# Patient Record
Sex: Female | Born: 1995 | Race: White | Hispanic: No | Marital: Single | Smoking: Never smoker
Health system: Southern US, Community
[De-identification: ages and names within clinical notes are randomized; demographics above are authoritative.]

## PROBLEM LIST (undated history)

## (undated) DIAGNOSIS — G43909 Migraine, unspecified, not intractable, without status migrainosus: Secondary | ICD-10-CM

## (undated) DIAGNOSIS — F419 Anxiety disorder, unspecified: Secondary | ICD-10-CM

## (undated) DIAGNOSIS — R630 Anorexia: Secondary | ICD-10-CM

## (undated) DIAGNOSIS — J45909 Unspecified asthma, uncomplicated: Secondary | ICD-10-CM

## (undated) HISTORY — DX: Anxiety disorder, unspecified: F41.9

## (undated) HISTORY — DX: Migraine, unspecified, not intractable, without status migrainosus: G43.909

## (undated) HISTORY — PX: LUMBAR PUNCTURE: SHX1985

## (undated) HISTORY — DX: Anorexia: R63.0

## (undated) HISTORY — DX: Unspecified asthma, uncomplicated: J45.909

---

## 2020-03-17 ENCOUNTER — Other Ambulatory Visit: Payer: Self-pay

## 2020-03-17 ENCOUNTER — Emergency Department (HOSPITAL_COMMUNITY)
Admission: EM | Admit: 2020-03-17 | Discharge: 2020-03-17 | Disposition: A | Discharge status: Home / Self Care | Payer: Self-pay | Attending: Emergency Medicine | Admitting: Emergency Medicine

## 2020-03-17 ENCOUNTER — Encounter (HOSPITAL_COMMUNITY): Payer: Self-pay

## 2020-03-17 DIAGNOSIS — R110 Nausea: Secondary | ICD-10-CM | POA: Insufficient documentation

## 2020-03-17 MED ORDER — ONDANSETRON 4 MG PO TBDP
4.0000 mg | ORAL_TABLET | Freq: Once | ORAL | Status: AC | PRN
  Administered 2020-03-17: 10:00:00 4 mg via ORAL
  Filled 2020-03-17: qty 1

## 2020-03-17 NOTE — ED Notes (Signed)
Patient denies pain and is resting comfortably.  

## 2020-03-17 NOTE — ED Notes (Signed)
Patient discharged, vetrbalized understanding of DC instructions.  ambulated to WR   0/ 10 pain    

## 2020-03-17 NOTE — ED Notes (Addendum)
Wants to leave AMA.  MD made aware, to speak with patient.

## 2020-03-17 NOTE — ED Provider Notes (Signed)
[facility] EMERGENCY DEPARTMENT Provider Note   CSN: [id number] Arrival date & time: 03/17/20  1003   History Chief Complaint  Patient presents with  . Nausea    Michelle Turner is a 24 y.o. female who presents with nausea. She states that she's been taking Clindamycin on an empty stomach for a dental problem. It's been giving her a lot of nausea so she came to the ED. She was given Zofran in triage and feels better and would like to be discharged. She is still having some indigestion but otherwise feels back to normal. She denies fever, chills, abdominal pain, vomiting, diarrhea, urinary symptoms. Her LMP was a week ago.  HPI     History reviewed. No pertinent past medical history.  There are no problems to display for this patient.   History reviewed. No pertinent surgical history.   OB History   No obstetric history on file.     No family history on file.     Home Medications Prior to Admission medications   Not on File    Allergies    Patient has no known allergies.  Review of Systems   Review of Systems  Constitutional: Negative for fever.  Gastrointestinal: Positive for nausea. Negative for abdominal pain, diarrhea and vomiting.    Physical Exam Updated Vital Signs BP (!) 109/56   Pulse 70   Temp 97.6 F (36.4 C) (Oral)   Resp (!) 22   Ht 5\' 4"  (1.626 m)   Wt 49.9 kg   SpO2 99%   BMI 18.88 kg/m   Physical Exam Vitals and nursing note reviewed.  Constitutional:      General: She is not in acute distress.    Appearance: Normal appearance. She is well-developed and well-nourished. She is not ill-appearing.     Comments: Tearful. Cooperative. NAD  HENT:     Head: Normocephalic and atraumatic.  Eyes:     General: No scleral icterus.       Right eye: No discharge.        Left eye: No discharge.     Conjunctiva/sclera: Conjunctivae normal.     Pupils: Pupils are equal, round, and reactive to light.  Cardiovascular:     Rate  and Rhythm: Normal rate.  Pulmonary:     Effort: Pulmonary effort is normal. No respiratory distress.  Abdominal:     General: There is no distension.  Musculoskeletal:     Cervical back: Normal range of motion.  Skin:    General: Skin is warm and dry.  Neurological:     Mental Status: She is alert and oriented to person, place, and time.  Psychiatric:        Mood and Affect: Mood and affect normal.        Behavior: Behavior normal.     ED Results / Procedures / Treatments   Labs (all labs ordered are listed, but only abnormal results are displayed) Labs Reviewed - No data to display  EKG None  Radiology No results found.  Procedures Procedures (including critical care time)  Medications Ordered in ED Medications  ondansetron (ZOFRAN-ODT) disintegrating tablet 4 mg (4 mg Oral Given 03/17/20 1025)    ED Course  I have reviewed the triage vital signs and the nursing notes.  Pertinent labs & imaging results that were available during my care of the patient were reviewed by me and considered in my medical decision making (see chart for details).  24 year old female presents with  nausea which she believes is related to taking Clindamycin on an empty stomach. 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 The Zofran she took in triage resolved her symptoms. She was PO challenged with a cracker which has caused her to be upset but she has been able to tolerate it. Her LMP was a week ago and she declines pregnancy testing. She denies any abdominal pain so I do not feel we need to do any further work up here in the ED. She was advised to return if worsening.  MDM Rules/Calculators/A&P                          Final Clinical Impression(s) / ED Diagnoses Final diagnoses:  Nausea    Rx / DC Orders ED Discharge Orders    None       Bethel Born, PA-C 03/17/20 1259    Pollyann Savoy, MD 03/17/20 1432

## 2020-03-17 NOTE — ED Notes (Signed)
Patient states she feels a little better after nausea meds.   Patient states she has been taking it on a empty stomach.

## 2020-03-17 NOTE — ED Triage Notes (Signed)
Pt reports nausea and indigestion after taking clindamycin for post wisdom tooth extraction, denies any difficulty breathing or swallowing. Reports she takes it on an empty stomach.

## 2020-03-17 NOTE — ED Notes (Signed)
Denies COVID interactions  Vaxxed- x2. Radiographer, therapeutic

## 2020-12-19 LAB — OB RESULTS CONSOLE GC/CHLAMYDIA
Chlamydia: NEGATIVE
Gonorrhea: NEGATIVE

## 2020-12-19 LAB — OB RESULTS CONSOLE ANTIBODY SCREEN: Antibody Screen: NEGATIVE

## 2020-12-19 LAB — OB RESULTS CONSOLE PLATELET COUNT: Platelets: 304

## 2020-12-19 LAB — OB RESULTS CONSOLE ABO/RH: RH Type: NEGATIVE

## 2020-12-19 LAB — OB RESULTS CONSOLE HEPATITIS B SURFACE ANTIGEN: Hepatitis B Surface Ag: NEGATIVE

## 2020-12-19 LAB — OB RESULTS CONSOLE HIV ANTIBODY (ROUTINE TESTING): HIV: NONREACTIVE

## 2020-12-19 LAB — OB RESULTS CONSOLE RPR: RPR: NONREACTIVE

## 2020-12-19 LAB — OB RESULTS CONSOLE HGB/HCT, BLOOD
HCT: 41 (ref 29–41)
Hemoglobin: 14.2

## 2020-12-19 LAB — HEPATITIS C ANTIBODY: HCV Ab: NEGATIVE

## 2020-12-19 LAB — OB RESULTS CONSOLE RUBELLA ANTIBODY, IGM: Rubella: IMMUNE

## 2021-02-23 ENCOUNTER — Encounter: Payer: Self-pay | Admitting: Obstetrics and Gynecology

## 2021-02-23 ENCOUNTER — Other Ambulatory Visit: Payer: Self-pay

## 2021-02-23 ENCOUNTER — Ambulatory Visit (INDEPENDENT_AMBULATORY_CARE_PROVIDER_SITE_OTHER): Payer: Medicaid Other | Admitting: Obstetrics and Gynecology

## 2021-02-23 DIAGNOSIS — Z3400 Encounter for supervision of normal first pregnancy, unspecified trimester: Secondary | ICD-10-CM | POA: Insufficient documentation

## 2021-02-23 DIAGNOSIS — O26899 Other specified pregnancy related conditions, unspecified trimester: Secondary | ICD-10-CM | POA: Insufficient documentation

## 2021-02-23 DIAGNOSIS — Z6791 Unspecified blood type, Rh negative: Secondary | ICD-10-CM | POA: Insufficient documentation

## 2021-02-23 LAB — POCT URINALYSIS DIPSTICK
Bilirubin, UA: NEGATIVE
Glucose, UA: NEGATIVE
Nitrite, UA: NEGATIVE
Protein, UA: NEGATIVE
Spec Grav, UA: 1.015 (ref 1.010–1.025)
Urobilinogen, UA: 0.2 E.U./dL
pH, UA: 6.5 (ref 5.0–8.0)

## 2021-02-23 MED ORDER — COMFORT FIT MATERNITY SUPP MED MISC: 0 refills | Status: DC

## 2021-02-23 NOTE — Progress Notes (Signed)
  Subjective:    Michelle Turner is a G1P0 [redacted]w[redacted]d being seen today for her first obstetrical visit.  Patient is transferring care from [facility]. Her obstetrical history is significant for first pregnancy. Patient was found to be Rh negative. Patient does intend to breast feed. Pregnancy history fully reviewed.  Patient reports no complaints.  Vitals:   02/23/21 1507  BP: 105/70  Pulse: 90  Weight: 125 lb (56.7 kg)    HISTORY: OB History  Gravida Para Term Preterm AB Living  1            SAB IAB Ectopic Multiple Live Births               # Outcome Date GA Lbr Len/2nd Weight Sex Delivery Anes PTL Lv  1 Current            Past Medical History:  Diagnosis Date  . Anorexia   . Anxiety   . Asthma   . Migraines    Past Surgical History:  Procedure Laterality Date  . LUMBAR PUNCTURE     No family history on file.   Exam    Uterus:  Fundal Height: 21 cm21- weeks      Assessment:    Pregnancy: G1P0 Patient Active Problem List   Diagnosis Date Noted  . Supervision of normal first pregnancy, antepartum 02/23/2021  . Rh negative state in antepartum period 02/23/2021        Plan:     Initial labs drawn. Prenatal vitamins. Problem list reviewed and updated. Genetic Screening discussed : results reviewed.  Ultrasound discussed; fetal survey: ordered.  Follow up in 4 weeks. 50% of 30 min visit spent on counseling and coordination of care.     Edmund Holcomb 02/23/2021

## 2021-02-23 NOTE — Patient Instructions (Signed)
Second Trimester of Pregnancy The second trimester of pregnancy is from week 13 through week 27. This is months 4 through 6 of pregnancy. The second trimester is often a time when you feel your best. Your body has adjusted to being pregnant, and you begin to feel better physically. During the second trimester: Morning sickness has lessened or stopped completely. You may have more energy. You may have an increase in appetite. The second trimester is also a time when the unborn baby (fetus) is growing rapidly. At the end of the sixth month, the fetus may be up to 12 inches long and weigh about 1 pounds. You will likely begin to feel the baby move (quickening) between 16 and 20 weeks of pregnancy. Body changes during your second trimester Your body continues to go through many changes during your second trimester. The changes vary and generally return to normal after the baby is born. Physical changes Your weight will continue to increase. You will notice your lower abdomen bulging out. You may begin to get stretch marks on your hips, abdomen, and breasts. Your breasts will continue to grow and to become tender. Dark spots or blotches (chloasma or mask of pregnancy) may develop on your face. A dark line from your belly button to the pubic area (linea nigra) may appear. You may have changes in your hair. These can include thickening of your hair, rapid growth, and changes in texture. Some people also have hair loss during or after pregnancy, or hair that feels dry or thin. Health changes You may develop headaches. You may have heartburn. You may develop constipation. You may develop hemorrhoids or swollen, bulging veins (varicose veins). Your gums may bleed and may be sensitive to brushing and flossing. You may urinate more often because the fetus is pressing on your bladder. You may have back pain. This is caused by: Weight gain. Pregnancy hormones that are relaxing the joints in your  pelvis. A shift in weight and the muscles that support your balance. Follow these instructions at home: Medicines Follow your health care provider's instructions regarding medicine use. Specific medicines may be either safe or unsafe to take during pregnancy. Do not take any medicines unless approved by your health care provider. Take a prenatal vitamin that contains at least 600 micrograms (mcg) of folic acid. Eating and drinking Eat a healthy diet that includes fresh fruits and vegetables, whole grains, good sources of protein such as meat, eggs, or tofu, and low-fat dairy products. Avoid raw meat and unpasteurized juice, milk, and cheese. These carry germs that can harm you and your baby. You may need to take these actions to prevent or treat constipation: Drink enough fluid to keep your urine pale yellow. Eat foods that are high in fiber, such as beans, whole grains, and fresh fruits and vegetables. Limit foods that are high in fat and processed sugars, such as fried or sweet foods. Activity Exercise only as directed by your health care provider. Most people can continue their usual exercise routine during pregnancy. Try to exercise for 30 minutes at least 5 days a week. Stop exercising if you develop contractions in your uterus. Stop exercising if you develop pain or cramping in the lower abdomen or lower back. Avoid exercising if it is very hot or humid or if you are at a high altitude. Avoid heavy lifting. If you choose to, you may have sex unless your health care provider tells you not to. Relieving pain and discomfort Wear a supportive bra  to prevent discomfort from breast tenderness. Take warm sitz baths to soothe any pain or discomfort caused by hemorrhoids. Use hemorrhoid cream if your health care provider approves. Rest with your legs raised (elevated) if you have leg cramps or low back pain. If you develop varicose veins: Wear support hose as told by your health care  provider. Elevate your feet for 15 minutes, 3-4 times a day. Limit salt in your diet. Safety Wear your seat belt at all times when driving or riding in a car. Talk with your health care provider if someone is verbally or physically abusive to you. Lifestyle Do not use hot tubs, steam rooms, or saunas. Do not douche. Do not use tampons or scented sanitary pads. Avoid cat litter boxes and soil used by cats. These carry germs that can cause birth defects in the baby and possibly loss of the fetus by miscarriage or stillbirth. Do not use herbal remedies, alcohol, illegal drugs, or medicines that are not approved by your health care provider. Chemicals in these products can harm your baby. Do not use any products that contain nicotine or tobacco, such as cigarettes, e-cigarettes, and chewing tobacco. If you need help quitting, ask your health care provider. General instructions During a routine prenatal visit, your health care provider will do a physical exam and other tests. He or she will also discuss your overall health. Keep all follow-up visits. This is important. Ask your health care provider for a referral to a local prenatal education class. Ask for help if you have counseling or nutritional needs during pregnancy. Your health care provider can offer advice or refer you to specialists for help with various needs. Where to find more information American Pregnancy Association: americanpregnancy.org [employer]: https://www.todd-brady.net/ Office on [employer] Health: [employer].nz Contact a health care provider if you have: A headache that does not go away when you take medicine. Vision changes or you see spots in front of your eyes. Mild pelvic cramps, pelvic pressure, or nagging pain in the abdominal area. Persistent nausea, vomiting, or diarrhea. A bad-smelling vaginal discharge or foul-smelling urine. Pain when you  urinate. Sudden or extreme swelling of your face, hands, ankles, feet, or legs. A fever. Get help right away if you: Have fluid leaking from your vagina. Have spotting or bleeding from your vagina. Have severe abdominal cramping or pain. Have difficulty breathing. Have chest pain. Have fainting spells. Have not felt your baby move for the time period told by your health care provider. Have new or increased pain, swelling, or redness in an arm or leg. Summary The second trimester of pregnancy is from week 13 through week 27 (months 4 through 6). Do not use herbal remedies, alcohol, illegal drugs, or medicines that are not approved by your health care provider. Chemicals in these products can harm your baby. Exercise only as directed by your health care provider. Most people can continue their usual exercise routine during pregnancy. Keep all follow-up visits. This is important. This information is not intended to replace advice given to you by your health care provider. Make sure you discuss any questions you have with your health care provider. Document Revised: 08/19/2019 Document Reviewed: 06/25/2019 Elsevier Patient Education  2022 [employer].  Third Trimester of Pregnancy The third trimester of pregnancy is from week 28 through week 40. This is months 7 through 9. The third trimester is a time when the unborn baby (fetus) is growing rapidly. At the end of the ninth month,  the fetus is about 20 inches long and weighs 6-10 pounds. Body changes during your third trimester During the third trimester, your body will continue to go through many changes. The changes vary and generally return to normal after your baby is born. Physical changes Your weight will continue to increase. You can expect to gain 25-35 pounds (11-16 kg) by the end of the pregnancy if you begin pregnancy at a normal weight. If you are underweight, you can expect to gain 28-40 lb (about 13-18 kg), and if you are  overweight, you can expect to gain 15-25 lb (about 7-11 kg). You may begin to get stretch marks on your hips, abdomen, and breasts. Your breasts will continue to grow and may hurt. A yellow fluid (colostrum) may leak from your breasts. This is the first milk you are producing for your baby. You may have changes in your hair. These can include thickening of your hair, rapid growth, and changes in texture. Some people also have hair loss during or after pregnancy, or hair that feels dry or thin. Your belly button may stick out. You may notice more swelling in your hands, face, or ankles. Health changes You may have heartburn. You may have constipation. You may develop hemorrhoids. You may develop swollen, bulging veins (varicose veins) in your legs. You may have increased body aches in the pelvis, back, or thighs. This is due to weight gain and increased hormones that are relaxing your joints. You may have increased tingling or numbness in your hands, arms, and legs. The skin on your abdomen may also feel numb. You may feel short of breath because of your expanding uterus. Other changes You may urinate more often because the fetus is moving lower into your pelvis and pressing on your bladder. You may have more problems sleeping. This may be caused by the size of your abdomen, an increased need to urinate, and an increase in your body's metabolism. You may notice the fetus "dropping," or moving lower in your abdomen (lightening). You may have increased vaginal discharge. You may notice that you have pain around your pelvic bone as your uterus distends. Follow these instructions at home: Medicines Follow your health care provider's instructions regarding medicine use. Specific medicines may be either safe or unsafe to take during pregnancy. Do not take any medicines unless approved by your health care provider. Take a prenatal vitamin that contains at least 600 micrograms (mcg) of folic  acid. Eating and drinking Eat a healthy diet that includes fresh fruits and vegetables, whole grains, good sources of protein such as meat, eggs, or tofu, and low-fat dairy products. Avoid raw meat and unpasteurized juice, milk, and cheese. These carry germs that can harm you and your baby. Eat 4 or 5 small meals rather than 3 large meals a day. You may need to take these actions to prevent or treat constipation: Drink enough fluid to keep your urine pale yellow. Eat foods that are high in fiber, such as beans, whole grains, and fresh fruits and vegetables. Limit foods that are high in fat and processed sugars, such as fried or sweet foods. Activity Exercise only as directed by your health care provider. Most people can continue their usual exercise routine during pregnancy. Try to exercise for 30 minutes at least 5 days a week. Stop exercising if you experience contractions in the uterus. Stop exercising if you develop pain or cramping in the lower abdomen or lower back. Avoid heavy lifting. Do not exercise if it  is very hot or humid or if you are at a high altitude. If you choose to, you may continue to have sex unless your health care provider tells you not to. Relieving pain and discomfort Take frequent breaks and rest with your legs raised (elevated) if you have leg cramps or low back pain. Take warm sitz baths to soothe any pain or discomfort caused by hemorrhoids. Use hemorrhoid cream if your health care provider approves. Wear a supportive bra to prevent discomfort from breast tenderness. If you develop varicose veins: Wear support hose as told by your health care provider. Elevate your feet for 15 minutes, 3-4 times a day. Limit salt in your diet. Safety Talk to your health care provider before traveling far distances. Do not use hot tubs, steam rooms, or saunas. Wear your seat belt at all times when driving or riding in a car. Talk with your health care provider if someone is  verbally or physically abusive to you. Preparing for birth To prepare for the arrival of your baby: Take prenatal classes to understand, practice, and ask questions about labor and delivery. Visit the hospital and tour the maternity area. Purchase a rear-facing car seat and make sure you know how to install it in your car. Prepare the baby's room or sleeping area. Make sure to remove all pillows and stuffed animals from the baby's crib to prevent suffocation. General instructions Avoid cat litter boxes and soil used by cats. These carry germs that can cause birth defects in the baby. If you have a cat, ask someone to clean the litter box for you. Do not douche or use tampons. Do not use scented sanitary pads. Do not use any products that contain nicotine or tobacco, such as cigarettes, e-cigarettes, and chewing tobacco. If you need help quitting, ask your health care provider. Do not use any herbal remedies, illegal drugs, or medicines that were not prescribed to you. Chemicals in these products can harm your baby. Do not drink alcohol. You will have more frequent prenatal exams during the third trimester. During a routine prenatal visit, your health care provider will do a physical exam, perform tests, and discuss your overall health. Keep all follow-up visits. This is important. Where to find more information American Pregnancy Association: americanpregnancy.org [employer]: https://www.todd-brady.net/ Office on [employer] Health: [employer] Contact a health care provider if you have: A fever. Mild pelvic cramps, pelvic pressure, or nagging pain in your abdominal area or lower back. Vomiting or diarrhea. Bad-smelling vaginal discharge or foul-smelling urine. Pain when you urinate. A headache that does not go away when you take medicine. Visual changes or see spots in front of your eyes. Get help right away if: Your water  breaks. You have regular contractions less than 5 minutes apart. You have spotting or bleeding from your vagina. You have severe abdominal pain. You have difficulty breathing. You have chest pain. You have fainting spells. You have not felt your baby move for the time period told by your health care provider. You have new or increased pain, swelling, or redness in an arm or leg. Summary The third trimester of pregnancy is from week 28 through week 40 (months 7 through 9). You may have more problems sleeping. This can be caused by the size of your abdomen, an increased need to urinate, and an increase in your body's metabolism. You will have more frequent prenatal exams during the third trimester. Keep all follow-up visits. This is important. This information  is not intended to replace advice given to you by your health care provider. Make sure you discuss any questions you have with your health care provider. Document Revised: 08/19/2019 Document Reviewed: 06/25/2019 Elsevier Patient Education  2022 [employer].  Contraception Choices Contraception, also called birth control, refers to methods or devices that prevent pregnancy. Hormonal methods Contraceptive implant A contraceptive implant is a thin, plastic tube that contains a hormone that prevents pregnancy. It is different from an intrauterine device (IUD). It is inserted into the upper part of the arm by a health care provider. Implants can be effective for up to 3 years. Progestin-only injections Progestin-only injections are injections of progestin, a synthetic form of the hormone progesterone. They are given every 3 months by a health care provider. Birth control pills Birth control pills are pills that contain hormones that prevent pregnancy. They must be taken once a day, preferably at the same time each day. A prescription is needed to use this method of contraception. Birth control patch The birth control patch contains  hormones that prevent pregnancy. It is placed on the skin and must be changed once a week for three weeks and removed on the fourth week. A prescription is needed to use this method of contraception. Vaginal ring A vaginal ring contains hormones that prevent pregnancy. It is placed in the vagina for three weeks and removed on the fourth week. After that, the process is repeated with a new ring. A prescription is needed to use this method of contraception. Emergency contraceptive Emergency contraceptives prevent pregnancy after unprotected sex. They come in pill form and can be taken up to 5 days after sex. They work best the sooner they are taken after having sex. Most emergency contraceptives are available without a prescription. This method should not be used as your only form of birth control. Barrier methods Female condom A female condom is a thin sheath that is worn over the penis during sex. Condoms keep sperm from going inside a woman's body. They can be used with a sperm-killing substance (spermicide) to increase their effectiveness. They should be thrown away after one use. Female condom A female condom is a soft, loose-fitting sheath that is put into the vagina before sex. The condom keeps sperm from going inside a woman's body. They should be thrown away after one use. Diaphragm A diaphragm is a soft, dome-shaped barrier. It is inserted into the vagina before sex, along with a spermicide. The diaphragm blocks sperm from entering the uterus, and the spermicide kills sperm. A diaphragm should be left in the vagina for 6-8 hours after sex and removed within 24 hours. A diaphragm is prescribed and fitted by a health care provider. A diaphragm should be replaced every 1-2 years, after giving birth, after gaining more than 15 lb (6.8 kg), and after pelvic surgery. Cervical cap A cervical cap is a round, soft latex or plastic cup that fits over the cervix. It is inserted into the vagina before sex,  along with spermicide. It blocks sperm from entering the uterus. The cap should be left in place for 6-8 hours after sex and removed within 48 hours. A cervical cap must be prescribed and fitted by a health care provider. It should be replaced every 2 years. Sponge A sponge is a soft, circular piece of polyurethane foam with spermicide in it. The sponge helps block sperm from entering the uterus, and the spermicide kills sperm. To use it, you make it wet and then insert  it into the vagina. It should be inserted before sex, left in for at least 6 hours after sex, and removed and thrown away within 30 hours. Spermicides Spermicides are chemicals that kill or block sperm from entering the cervix and uterus. They can come as a cream, jelly, suppository, foam, or tablet. A spermicide should be inserted into the vagina with an applicator at least 10-15 minutes before sex to allow time for it to work. The process must be repeated every time you have sex. Spermicides do not require a prescription. Intrauterine contraception Intrauterine device (IUD) An IUD is a T-shaped device that is put in a woman's uterus. There are two types: Hormone IUD.This type contains progestin, a synthetic form of the hormone progesterone. This type can stay in place for 3-5 years. Copper IUD.This type is wrapped in copper wire. It can stay in place for 10 years. Permanent methods of contraception Female tubal ligation In this method, a woman's fallopian tubes are sealed, tied, or blocked during surgery to prevent eggs from traveling to the uterus. Hysteroscopic sterilization In this method, a small, flexible insert is placed into each fallopian tube. The inserts cause scar tissue to form in the fallopian tubes and block them, so sperm cannot reach an egg. The procedure takes about 3 months to be effective. Another form of birth control must be used during those 3 months. Female sterilization This is a procedure to tie off the tubes  that carry sperm (vasectomy). After the procedure, the man can still ejaculate fluid (semen). Another form of birth control must be used for 3 months after the procedure. Natural planning methods Natural family planning In this method, a couple does not have sex on days when the woman could become pregnant. Calendar method In this method, the woman keeps track of the length of each menstrual cycle, identifies the days when pregnancy can happen, and does not have sex on those days. Ovulation method In this method, a couple avoids sex during ovulation. Symptothermal method This method involves not having sex during ovulation. The woman typically checks for ovulation by watching changes in her temperature and in the consistency of cervical mucus. Post-ovulation method In this method, a couple waits to have sex until after ovulation. Where to find more information Centers for Disease Control and Prevention: [employer] Summary Contraception, also called birth control, refers to methods or devices that prevent pregnancy. Hormonal methods of contraception include implants, injections, pills, patches, vaginal rings, and emergency contraceptives. Barrier methods of contraception can include female condoms, female condoms, diaphragms, cervical caps, sponges, and spermicides. There are two types of IUDs (intrauterine devices). An IUD can be put in a woman's uterus to prevent pregnancy for 3-5 years. Permanent sterilization can be done through a procedure for males and females. Natural family planning methods involve nothaving sex on days when the woman could become pregnant. This information is not intended to replace advice given to you by your health care provider. Make sure you discuss any questions you have with your health care provider. Document Revised: 08/17/2019 Document Reviewed: 08/17/2019 Elsevier Patient Education  2022 [employer].

## 2021-02-23 NOTE — Progress Notes (Signed)
Pt has had some throbbing pain on lower right side, noticed after long commute.

## 2021-02-25 LAB — URINE CULTURE, OB REFLEX

## 2021-02-25 LAB — CULTURE, OB URINE

## 2021-03-15 ENCOUNTER — Other Ambulatory Visit: Payer: Self-pay | Admitting: Obstetrics and Gynecology

## 2021-03-15 ENCOUNTER — Other Ambulatory Visit: Payer: Self-pay | Admitting: *Deleted

## 2021-03-15 ENCOUNTER — Ambulatory Visit: Payer: BLUE CROSS/BLUE SHIELD | Attending: Obstetrics and Gynecology

## 2021-03-15 ENCOUNTER — Other Ambulatory Visit: Payer: Self-pay

## 2021-03-15 DIAGNOSIS — Z3400 Encounter for supervision of normal first pregnancy, unspecified trimester: Secondary | ICD-10-CM | POA: Diagnosis not present

## 2021-03-15 DIAGNOSIS — Z3402 Encounter for supervision of normal first pregnancy, second trimester: Secondary | ICD-10-CM | POA: Insufficient documentation

## 2021-03-15 DIAGNOSIS — Z363 Encounter for antenatal screening for malformations: Secondary | ICD-10-CM | POA: Insufficient documentation

## 2021-03-15 DIAGNOSIS — O365990 Maternal care for other known or suspected poor fetal growth, unspecified trimester, not applicable or unspecified: Secondary | ICD-10-CM

## 2021-03-15 DIAGNOSIS — Z148 Genetic carrier of other disease: Secondary | ICD-10-CM | POA: Diagnosis not present

## 2021-03-15 IMAGING — US US MFM OB DETAIL+14 WK
1 series · 13 of 28 positions shown · non-contrast
Comparison: none

[Series 1: us mfm ob detail+14 wk · 13 of 78 slices shown]
[im 3/78]
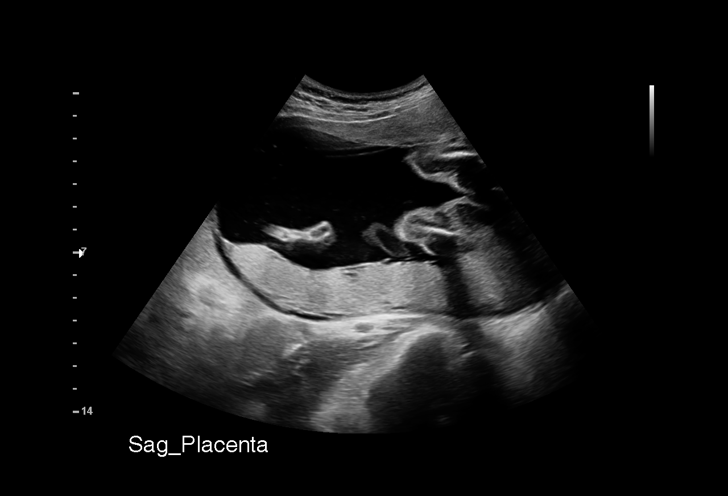
[im 9/78]
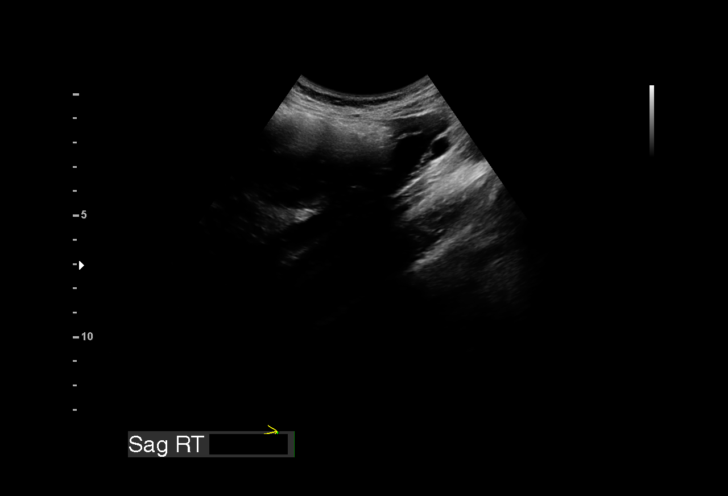
[im 15/78]
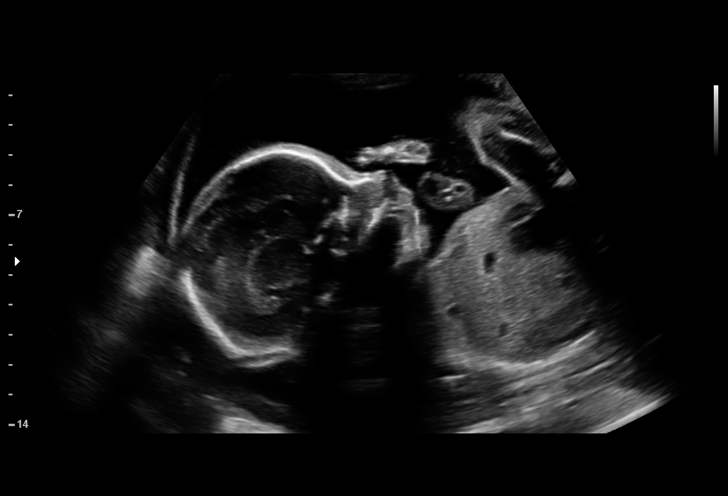
[im 20/78]
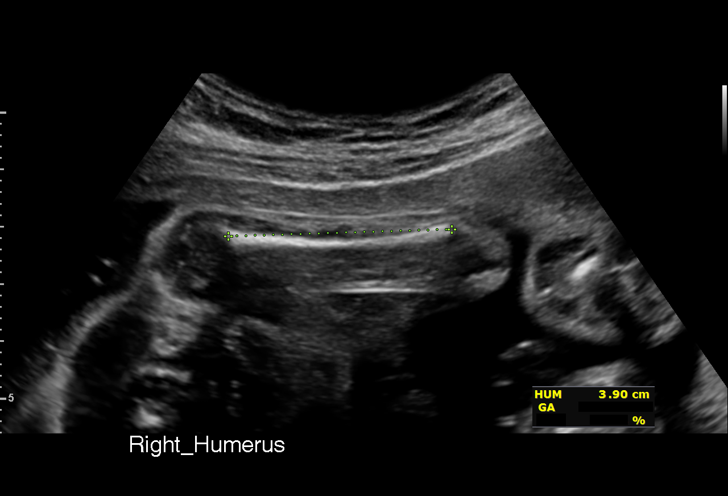
[im 26/78]
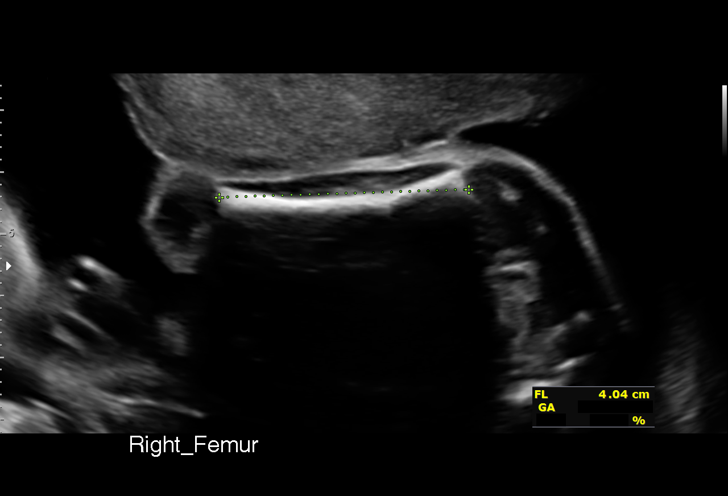
[im 32/78]
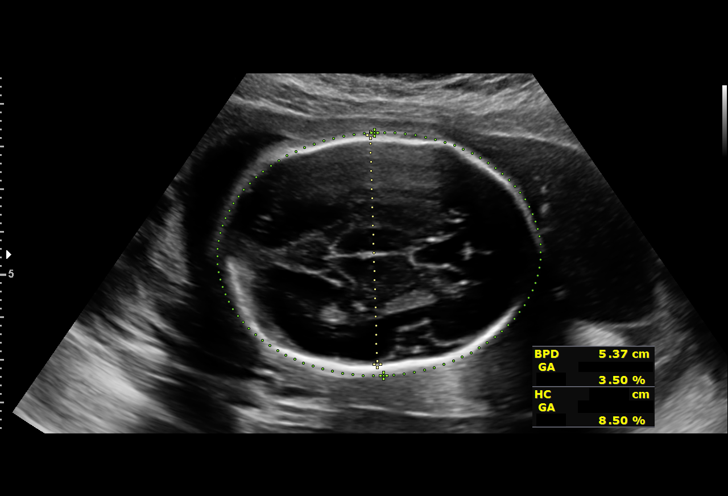
[im 40/78]
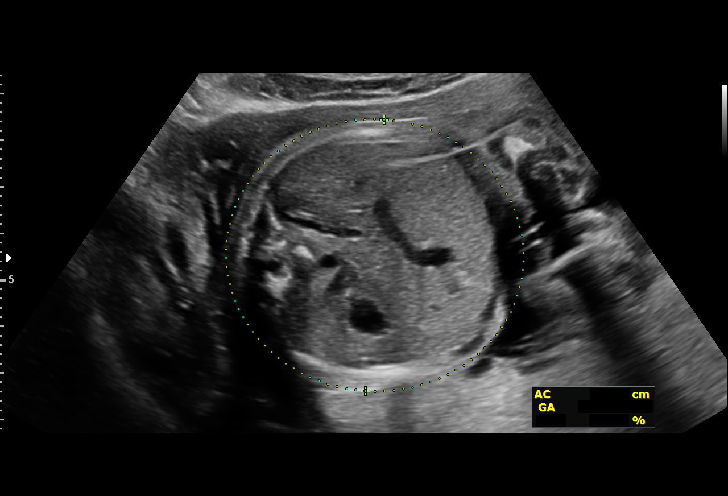
[im 46/78]
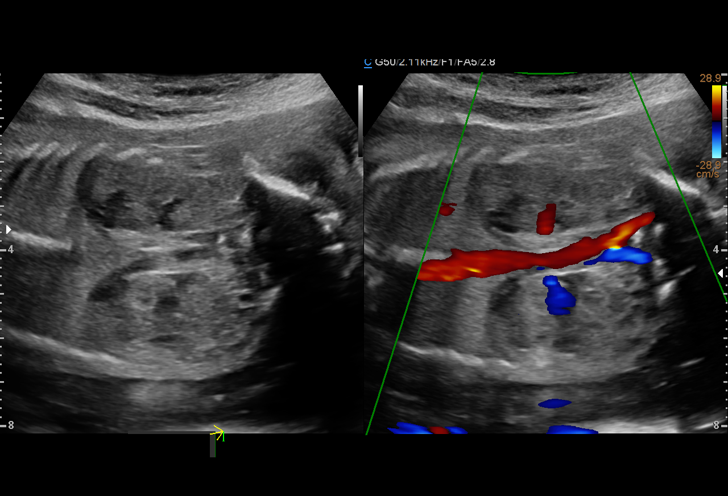
[im 52/78]
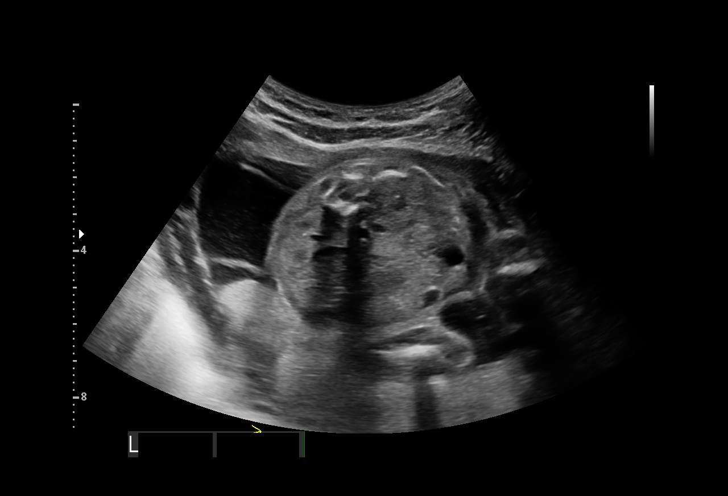
[im 58/78]
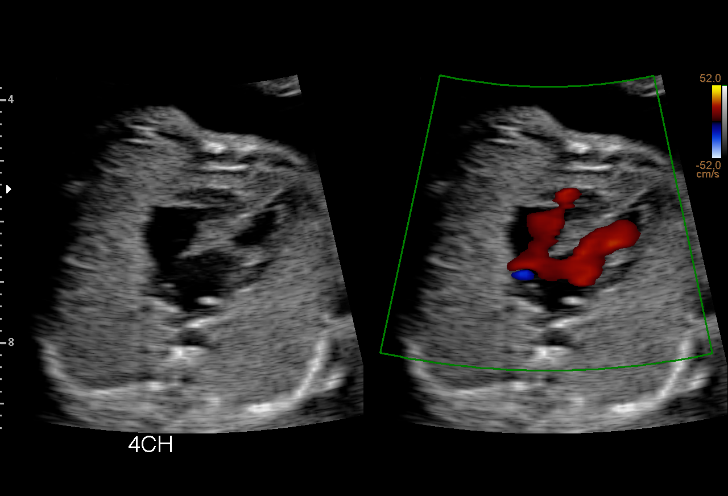
[im 63/78]
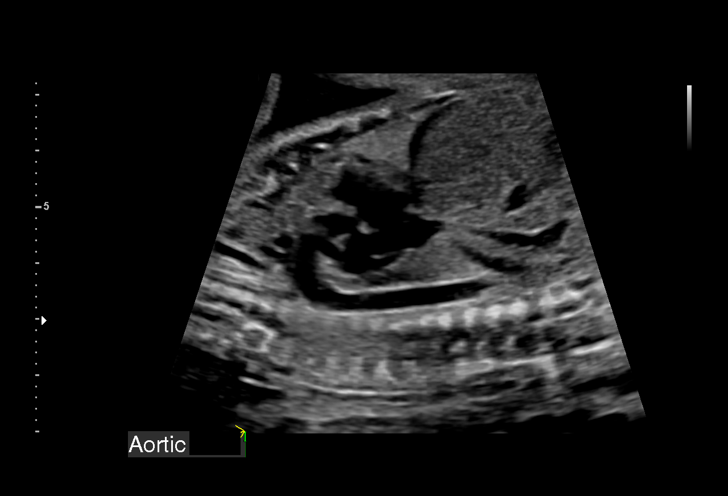
[im 69/78]
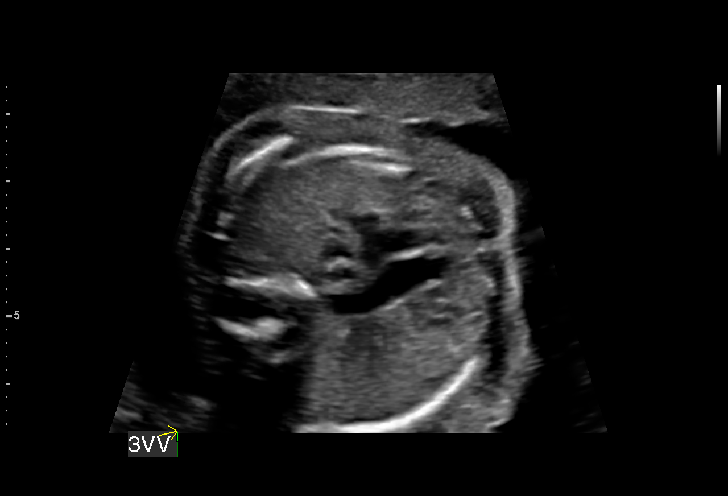
[im 75/78]
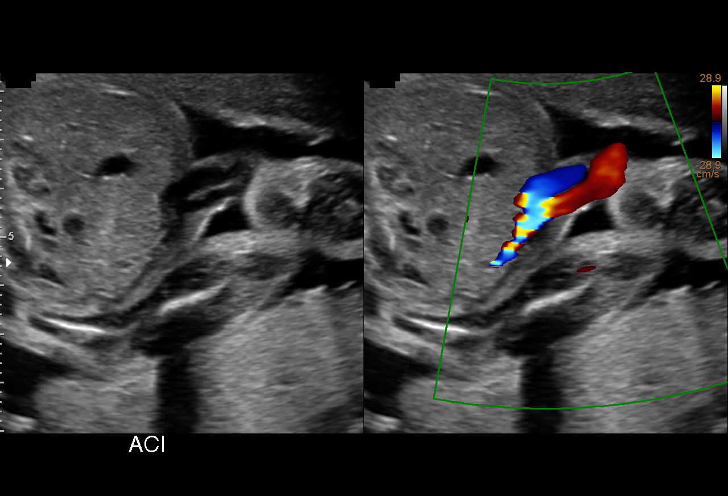

[13 of 28 positions shown; findings below may reference images not displayed]

Indications

 Genetic carrier (TAMEKA)          [VN]
 LR NIPS/ Negative AFP
 24 weeks gestation of pregnancy
 Antenatal screening for malformations          [VN]
 Transfer of Care
Fetal Evaluation

 Num Of Fetuses:         1
 Fetal Heart Rate(bpm):  162
 Cardiac Activity:       Observed
 Presentation:           Breech
 Placenta:               Posterior
 P. Cord Insertion:      Visualized, central

 Amniotic Fluid
 AFI FV:      Within normal limits

                             Largest Pocket(cm)

Biometry

 BPD:      54.3  mm     G. Age:  22w 4d          5  %    CI:        67.47   %    70 - 86
                                                         FL/HC:      19.1   %    18.7 -
 HC:      211.6  mm     G. Age:  23w 2d         10  %    HC/AC:      1.12        1.05 -
 AC:      188.8  mm     G. Age:  23w 4d         30  %    FL/BPD:     74.4   %    71 - 87
 FL:       40.4  mm     G. Age:  23w 0d         13  %    FL/AC:      21.4   %    20 - 24
 HUM:      38.7  mm     G. Age:  23w 5d         35  %
 CER:        28  mm     G. Age:  24w 6d         84  %
 LV:        5.6  mm
 CM:        3.9  mm

 Est. FW:     584  gm      1 lb 5 oz     16  %
OB History

 Gravidity:    1         Term:   0        Prem:   0        SAB:   0
 TOP:          0
Gestational Age

 LMP:           24w 0d        Date:  [DATE]                 EDD:   [DATE]
 U/S Today:     23w 1d                                        EDD:   [DATE]
 Best:          24w 0d     Det. By:  LMP  ([DATE])          EDD:   [DATE]
Anatomy

 Cranium:               Appears normal         LVOT:                   Appears normal
 Cavum:                 Appears normal         Aortic Arch:            Appears normal
 Ventricles:            Appears normal         Ductal Arch:            Appears normal
 Choroid Plexus:        Appears normal         Diaphragm:              Appears normal
 Cerebellum:            Appears normal         Stomach:                Appears normal, left
                                                                       sided
 Posterior Fossa:       Appears normal         Abdomen:                Appears normal
 Nuchal Fold:           Appears normal         Abdominal Wall:         Appears nml (cord
                                                                       insert, abd wall)
 Face:                  Appears normal         Cord Vessels:           Appears normal (3
                        (orbits and profile)                           vessel cord)
 Lips:                  Appears normal         Kidneys:                Appear normal
 Palate:                Appears normal         Bladder:                Appears normal
 Thoracic:              Appears normal         Spine:                  Appears normal
 Heart:                 Appears normal         Upper Extremities:      Appears normal
                        (4CH, axis, and
                        situs)
 RVOT:                  Appears normal         Lower Extremities:      Appears normal

 Other:  Fetus appears to be female. Heels/feet, open hands/5th digits, nasal
         bone, lenses, VC, 3VV and 3VTV visualized.
Cervix Uterus Adnexa

 Cervix
 Length:           4.26  cm.
 Normal appearance by transabdominal scan.

 Uterus
 No abnormality visualized.

 Right Ovary
 Not visualized.

 Left Ovary
 Not visualized.

 Adnexa
 No adnexal mass visualized.
Impression

 Single intrauterine pregnancy here for a detailed anatomy as
 she is a carrier for smith lemli [REDACTED].
 Normal anatomy with measurements consistent with dates
 There is good fetal movement and amniotic fluid volume

 I discussed with Ms. TAMEKA that the EFW was at the 16th%.
 Given this we have scheduled her to return in 4-6 weeks.
Recommendations

 Follow up growth in 4-6 weeks.

## 2021-03-22 ENCOUNTER — Encounter: Payer: Self-pay | Admitting: Obstetrics and Gynecology

## 2021-03-22 ENCOUNTER — Ambulatory Visit (INDEPENDENT_AMBULATORY_CARE_PROVIDER_SITE_OTHER): Payer: BLUE CROSS/BLUE SHIELD | Admitting: Obstetrics and Gynecology

## 2021-03-22 ENCOUNTER — Other Ambulatory Visit: Payer: Self-pay

## 2021-03-22 VITALS — BP 100/64 | HR 73 | Wt 133.0 lb

## 2021-03-22 DIAGNOSIS — Z23 Encounter for immunization: Secondary | ICD-10-CM | POA: Diagnosis not present

## 2021-03-22 DIAGNOSIS — Z3400 Encounter for supervision of normal first pregnancy, unspecified trimester: Secondary | ICD-10-CM

## 2021-03-22 DIAGNOSIS — Z3480 Encounter for supervision of other normal pregnancy, unspecified trimester: Secondary | ICD-10-CM

## 2021-03-22 DIAGNOSIS — Z3A25 25 weeks gestation of pregnancy: Secondary | ICD-10-CM | POA: Diagnosis not present

## 2021-03-22 DIAGNOSIS — Z6791 Unspecified blood type, Rh negative: Secondary | ICD-10-CM

## 2021-03-22 DIAGNOSIS — O360920 Maternal care for other rhesus isoimmunization, second trimester, not applicable or unspecified: Secondary | ICD-10-CM

## 2021-03-22 NOTE — Progress Notes (Signed)
° °  PRENATAL VISIT NOTE  Subjective:  Michelle Turner is a 25 y.o. G1P0 at [redacted]w[redacted]d being seen today for ongoing prenatal care.  She is currently monitored for the following issues for this low-risk pregnancy and has Supervision of normal first pregnancy, antepartum and Rh negative state in antepartum period on their problem list.  Patient reports no complaints.  Contractions: Not present. Vag. Bleeding: None.  Movement: Present. Denies leaking of fluid.   The following portions of the patient's history were reviewed and updated as appropriate: allergies, current medications, past family history, past medical history, past social history, past surgical history and problem list.   Objective:   Vitals:   03/22/21 1449  BP: 100/64  Pulse: 73  Weight: 133 lb (60.3 kg)    Fetal Status: Fetal Heart Rate (bpm): 160 Fundal Height: 25 cm Movement: Present     General:  Alert, oriented and cooperative. Patient is in no acute distress.  Skin: Skin is warm and dry. No rash noted.   Cardiovascular: Normal heart rate noted  Respiratory: Normal respiratory effort, no problems with respiration noted  Abdomen: Soft, gravid, appropriate for gestational age.  Pain/Pressure: Present     Pelvic: Cervical exam deferred        Extremities: Normal range of motion.  Edema: Trace  Mental Status: Normal mood and affect. Normal behavior. Normal judgment and thought content.   Assessment and Plan:  Pregnancy: G1P0 at [redacted]w[redacted]d 1. Supervision of normal first pregnancy, antepartum 28w labs next time Tdap next time Growth at anatomy scan was 16%ile, so has f/u on 1/18 to assure appropriate interval growth  2. Rh negative state in antepartum period Rhogam at next appt  Preterm labor symptoms and general obstetric precautions including but not limited to vaginal bleeding, contractions, leaking of fluid and fetal movement were reviewed in detail with the patient. Please refer to After Visit Summary for other counseling  recommendations.   Return in about 4 weeks (around 04/19/2021) for OB VISIT, MD or APP, 2 hr GTT, Tdap.  Future Appointments  Date Time Provider Department Center  04/12/2021  2:30 PM [facility] NURSE [facility]  04/12/2021  2:45 PM [facility] US6 [facility]    Milas Hock, MD

## 2021-03-26 NOTE — L&D Delivery Note (Addendum)
Delivery Note ?Michelle Turner is a 26 y.o. G1P0 at [redacted]w[redacted]d admitted for [place].  ? ?GBS Status: Negative/-- (03/16 1408) ?Maximum Maternal Temperature: 98.4 F ? ?Labor course: Initial SVE: 0/50/-3. Augmentation with: Cytotec and IP Foley. She then progressed to complete.  ?ROM: 3h 79m with clear fluid ? ?Birth: At 0349 a viable female was delivered via spontaneous vaginal delivery (Presentation: ROA  ). Nuchal cord present: No.  Shoulders and body delivered in usual fashion. Infant placed directly on mom's abdomen for bonding/skin-to-skin, baby dried and stimulated. Cord clamped x 2 after 1 minute and cut by FOB.  Cord blood collected.  The placenta separated spontaneously and delivered schultz via gentle cord traction.  Pitocin infused rapidly IV per protocol.  Fundus firm with massage however bleeding still brisk; administered Cytotec 400 mcg rectally, Cytotec 400 mcg buccal, and TXA 1000 MG IV with good results. ?Placenta inspected and appears to be intact with a 3 VC.  Placenta/Cord with the following complications: N/A .  Cord pH: N/A ?Sponge and instrument count were correct x2. ? ?Intrapartum complications:  None ?Anesthesia:  local (1% lidocaine) ?Episiotomy: N/A ?Lacerations:  2nd degree perineal ?Suture Repair: 3.0 Monocryl ?EBL (mL): 550 ? ? ?Infant: ?APGAR (1 MIN): 9   ?APGAR (5 MINS): 9   ?APGAR (10 MINS):    ?Infant weight: pending ? ?Mom to postpartum.  Baby to Couplet care / Skin to Skin. Placenta to L&D   ?Plans to Breastfeed ?Contraception: IUD (outpt) ?Circumcision: N/A ? ?Note sent to [facility]: [facility] for pp visit. ? ?Michelle Turner , SNM ?07/13/2021 ?4:35 AM ? ? Patient is a G1P0 at [redacted]w[redacted]d who was admitted for postdates IOL, significant hx of Rh neg but otherwise uncomplicated prenatal course.  She progressed with augmentation via cytotec x 2 doses and cervical foley placement with SROM upon foley expulsion. ? ?I was gloved and present for delivery in its entirety.  Second stage of labor progressed,  baby delivered after pushing x 71mins with contractions.  Mild decels during second stage noted. ? Complications: bleeding initially brisk after placenta detached; given cytotec 452mcg PR and 444mcg buccal along with TXA ? Lacerations: 2nd degree perineal ? EBL: 550cc ? ?Michelle Turner, CNM ?4:52 AM ?07/13/2021 ? ?  ?

## 2021-03-30 ENCOUNTER — Other Ambulatory Visit: Payer: Self-pay

## 2021-03-30 ENCOUNTER — Other Ambulatory Visit: Payer: Self-pay | Admitting: Obstetrics and Gynecology

## 2021-03-30 DIAGNOSIS — J45909 Unspecified asthma, uncomplicated: Secondary | ICD-10-CM

## 2021-03-30 MED ORDER — ALBUTEROL SULFATE 108 (90 BASE) MCG/ACT IN AEPB: 1.0000 | INHALATION_SPRAY | RESPIRATORY_TRACT | 1 refills | Status: DC | PRN

## 2021-04-12 ENCOUNTER — Encounter: Payer: Self-pay | Admitting: *Deleted

## 2021-04-12 ENCOUNTER — Other Ambulatory Visit: Payer: Self-pay | Admitting: *Deleted

## 2021-04-12 ENCOUNTER — Ambulatory Visit: Payer: BLUE CROSS/BLUE SHIELD | Admitting: *Deleted

## 2021-04-12 ENCOUNTER — Ambulatory Visit: Payer: BLUE CROSS/BLUE SHIELD | Attending: Maternal & Fetal Medicine

## 2021-04-12 ENCOUNTER — Other Ambulatory Visit: Payer: Self-pay

## 2021-04-12 VITALS — BP 106/62 | HR 87

## 2021-04-12 DIAGNOSIS — Z3A28 28 weeks gestation of pregnancy: Secondary | ICD-10-CM | POA: Insufficient documentation

## 2021-04-12 DIAGNOSIS — O365930 Maternal care for other known or suspected poor fetal growth, third trimester, not applicable or unspecified: Secondary | ICD-10-CM | POA: Diagnosis present

## 2021-04-12 DIAGNOSIS — Z362 Encounter for other antenatal screening follow-up: Secondary | ICD-10-CM | POA: Diagnosis not present

## 2021-04-12 DIAGNOSIS — Z3400 Encounter for supervision of normal first pregnancy, unspecified trimester: Secondary | ICD-10-CM | POA: Insufficient documentation

## 2021-04-12 DIAGNOSIS — Z6791 Unspecified blood type, Rh negative: Secondary | ICD-10-CM | POA: Insufficient documentation

## 2021-04-12 DIAGNOSIS — Z148 Genetic carrier of other disease: Secondary | ICD-10-CM | POA: Diagnosis not present

## 2021-04-12 DIAGNOSIS — O280 Abnormal hematological finding on antenatal screening of mother: Secondary | ICD-10-CM

## 2021-04-12 DIAGNOSIS — O365990 Maternal care for other known or suspected poor fetal growth, unspecified trimester, not applicable or unspecified: Secondary | ICD-10-CM

## 2021-04-12 DIAGNOSIS — O26899 Other specified pregnancy related conditions, unspecified trimester: Secondary | ICD-10-CM | POA: Insufficient documentation

## 2021-04-19 ENCOUNTER — Other Ambulatory Visit (HOSPITAL_COMMUNITY)
Admission: RE | Admit: 2021-04-19 | Discharge: 2021-04-19 | Disposition: A | Discharge status: Home / Self Care | Payer: BLUE CROSS/BLUE SHIELD | Source: Ambulatory Visit | Attending: Nurse Practitioner | Admitting: Nurse Practitioner

## 2021-04-19 ENCOUNTER — Other Ambulatory Visit: Payer: Medicaid Other

## 2021-04-19 ENCOUNTER — Other Ambulatory Visit: Payer: Self-pay

## 2021-04-19 ENCOUNTER — Ambulatory Visit (INDEPENDENT_AMBULATORY_CARE_PROVIDER_SITE_OTHER): Payer: BLUE CROSS/BLUE SHIELD | Admitting: Nurse Practitioner

## 2021-04-19 VITALS — BP 101/59 | HR 86 | Wt 142.2 lb

## 2021-04-19 DIAGNOSIS — Z3400 Encounter for supervision of normal first pregnancy, unspecified trimester: Secondary | ICD-10-CM

## 2021-04-19 DIAGNOSIS — Z6791 Unspecified blood type, Rh negative: Secondary | ICD-10-CM

## 2021-04-19 DIAGNOSIS — Z3A29 29 weeks gestation of pregnancy: Secondary | ICD-10-CM

## 2021-04-19 DIAGNOSIS — N9089 Other specified noninflammatory disorders of vulva and perineum: Secondary | ICD-10-CM | POA: Diagnosis present

## 2021-04-19 DIAGNOSIS — O26899 Other specified pregnancy related conditions, unspecified trimester: Secondary | ICD-10-CM

## 2021-04-19 MED ORDER — RHO D IMMUNE GLOBULIN 1500 UNIT/2ML IJ SOSY
300.0000 ug | PREFILLED_SYRINGE | Freq: Once | INTRAMUSCULAR | Status: AC
  Administered 2021-04-19: 09:00:00 300 ug via INTRAMUSCULAR

## 2021-04-19 NOTE — Progress Notes (Signed)
° ° °  Subjective:  Michelle Turner is a 26 y.o. G1P0 at [redacted]w[redacted]d being seen today for ongoing prenatal care.  She is currently monitored for the following issues for this low-risk pregnancy and has Supervision of normal first pregnancy, antepartum and Rh negative state in antepartum period on their problem list.  Patient reports headache.  Contractions: Not present. Vag. Bleeding: None.  Movement: Present. Denies leaking of fluid.   The following portions of the patient's history were reviewed and updated as appropriate: allergies, current medications, past family history, past medical history, past social history, past surgical history and problem list. Problem list updated.  Objective:   Vitals:   04/19/21 0852  BP: (!) 101/59  Pulse: 86  Weight: 142 lb 3.2 oz (64.5 kg)    Fetal Status: Fetal Heart Rate (bpm): 141 Fundal Height: 30 cm Movement: Present     General:  Alert, oriented and cooperative. Patient is in no acute distress.  Skin: Skin is warm and dry. No rash noted.   Cardiovascular: Normal heart rate noted  Respiratory: Normal respiratory effort, no problems with respiration noted  Abdomen: Soft, gravid, appropriate for gestational age. Pain/Pressure: Absent     Pelvic:  Cervical exam deferred        Extremities: Normal range of motion.  Edema: None  Mental Status: Normal mood and affect. Normal behavior. Normal judgment and thought content.   Urinalysis:      Assessment and Plan:  Pregnancy: G1P0 at [redacted]w[redacted]d  1. Supervision of normal first pregnancy, antepartum Has a headache today and glucose test not helping her feel better. Wants to get TDAP at next visit. Reviewed taking classes.  See AVS Considering IUD as contraception.  Does not want more children.  - Glucose Tolerance, 2 Hours w/1 Hour - HIV antibody (with reflex) - RPR - CBC - Cervicovaginal ancillary only( [place])  2. Rh negative state in antepartum period given today  3. Labial irritation Labia minora  edematous bilaterally- was wearing tight lycra slacks and states she usually wears those.  Advised to wear looser pants.  Also checking for possible yeast infection.  - Cervicovaginal ancillary only( [place])  4. [redacted] weeks gestation of pregnancy   Preterm labor symptoms and general obstetric precautions including but not limited to vaginal bleeding, contractions, leaking of fluid and fetal movement were reviewed in detail with the patient. Please refer to After Visit Summary for other counseling recommendations.  Return in about 2 weeks (around 05/03/2021) for in person ROB - midwife if available.  Nolene Bernheim, RN, MSN, NP-BC Nurse Practitioner, [facility] for [employer], [facility] 04/19/2021 11:03 AM

## 2021-04-19 NOTE — Patient Instructions (Signed)
[email or web address] to sign up for classes on childbirth and breastfeeding

## 2021-04-20 LAB — GLUCOSE TOLERANCE, 2 HOURS W/ 1HR
Glucose, 1 hour: 134 mg/dL (ref 70–179)
Glucose, 2 hour: 72 mg/dL (ref 70–152)
Glucose, Fasting: 81 mg/dL (ref 70–91)

## 2021-04-20 LAB — CBC
Hematocrit: 36.6 % (ref 34.0–46.6)
Hemoglobin: 12.5 g/dL (ref 11.1–15.9)
MCH: 30 pg (ref 26.6–33.0)
MCHC: 34.2 g/dL (ref 31.5–35.7)
MCV: 88 fL (ref 79–97)
Platelets: 195 10*3/uL (ref 150–450)
RBC: 4.16 x10E6/uL (ref 3.77–5.28)
RDW: 12.7 % (ref 11.7–15.4)
WBC: 11.8 10*3/uL — ABNORMAL HIGH (ref 3.4–10.8)

## 2021-04-20 LAB — CERVICOVAGINAL ANCILLARY ONLY
Candida Glabrata: NEGATIVE
Candida Vaginitis: POSITIVE — AB
Comment: NEGATIVE
Comment: NEGATIVE

## 2021-04-20 LAB — RPR: RPR Ser Ql: NONREACTIVE

## 2021-04-20 LAB — HIV ANTIBODY (ROUTINE TESTING W REFLEX): HIV Screen 4th Generation wRfx: NONREACTIVE

## 2021-04-20 MED ORDER — TERCONAZOLE 0.4 % VA CREA: 1.0000 | TOPICAL_CREAM | Freq: Every day | VAGINAL | 1 refills | Status: AC

## 2021-04-20 NOTE — Addendum Note (Signed)
Addended by: Currie Paris on: 04/20/2021 12:34 PM   Modules accepted: Orders

## 2021-04-25 ENCOUNTER — Encounter: Payer: BLUE CROSS/BLUE SHIELD | Admitting: Licensed Clinical Social Worker

## 2021-05-04 ENCOUNTER — Other Ambulatory Visit: Payer: Self-pay

## 2021-05-04 ENCOUNTER — Ambulatory Visit (INDEPENDENT_AMBULATORY_CARE_PROVIDER_SITE_OTHER): Payer: BLUE CROSS/BLUE SHIELD

## 2021-05-04 VITALS — BP 117/75 | HR 110 | Wt 143.0 lb

## 2021-05-04 DIAGNOSIS — Z23 Encounter for immunization: Secondary | ICD-10-CM | POA: Diagnosis not present

## 2021-05-04 DIAGNOSIS — J45909 Unspecified asthma, uncomplicated: Secondary | ICD-10-CM

## 2021-05-04 DIAGNOSIS — Z3403 Encounter for supervision of normal first pregnancy, third trimester: Secondary | ICD-10-CM

## 2021-05-04 DIAGNOSIS — Z3400 Encounter for supervision of normal first pregnancy, unspecified trimester: Secondary | ICD-10-CM

## 2021-05-04 DIAGNOSIS — Z3A31 31 weeks gestation of pregnancy: Secondary | ICD-10-CM

## 2021-05-04 MED ORDER — ALBUTEROL SULFATE HFA 108 (90 BASE) MCG/ACT IN AERS: 2.0000 | INHALATION_SPRAY | Freq: Four times a day (QID) | RESPIRATORY_TRACT | 2 refills | Status: DC | PRN

## 2021-05-04 NOTE — Progress Notes (Signed)
TDAP given in LD, tolerated well.

## 2021-05-04 NOTE — Progress Notes (Signed)
LOW-RISK PREGNANCY OFFICE VISIT  Patient name: Michelle Turner MRN [id number]  Date of birth: 02/28/1996 Chief Complaint:   Routine Prenatal Visit  Subjective:   Michelle Turner is a 26 y.o. G1P0 female at [redacted]w[redacted]d with an Estimated Date of Delivery: 07/05/21 being seen today for ongoing management of a low-risk pregnancy aeb has Supervision of normal first pregnancy, antepartum and Rh negative state in antepartum period on their problem list.  Patient presents today with  pressure .  Patient endorses fetal movement. Patient denies abdominal cramping or contractions.  Patient denies vaginal concerns including abnormal discharge, leaking of fluid, and bleeding.  She reports recent treatment of yeast infection and reports symptoms of irritation and swelling have subsided.  Patient denies issues with urination, constipation, or diarrhea.   Patient reports some issues with her asthma and states she was unable to receive her inhaler, from the pharmacy, when last requested.  Contractions: Not present. Vag. Bleeding: None.  Movement: Present.  Reviewed past medical,surgical, social, obstetrical and family history as well as problem list, medications and allergies.  Objective   Vitals:   05/04/21 0936  BP: 117/75  Pulse: (!) 110  Weight: 143 lb (64.9 kg)  Body mass index is 24.55 kg/m.  Total Weight Gain:38 lb (17.2 kg)         Physical Examination:   General appearance: Well appearing, and in no distress  Mental status: Alert, oriented to person, place, and time  Skin: Warm & dry  Cardiovascular: Normal heart rate noted  Respiratory: Normal respiratory effort, no distress  Abdomen: Soft, gravid, nontender, AGA with Fundal Height: 32 cm  Pelvic: Cervical exam deferred           Extremities: Edema: None  Fetal Status: Fetal Heart Rate (bpm): 137  Movement: Present   No results found for this or any previous visit (from the past 24 hour(s)).  Assessment & Plan:  Low-risk pregnancy of a 26  y.o., G1P0 at [redacted]w[redacted]d with an Estimated Date of Delivery: 07/05/21   1. Supervision of normal first pregnancy, antepartum -Anticipatory guidance for upcoming appts. -Patient to schedule next appt in 2-4 weeks for an in-person visit. -Requests and will give TDap today. -Reviewed what it is and benefits.   2. [redacted] weeks gestation of pregnancy -Doing well. -Does not desire more children! -Wants PPIUD  3. Asthma, unspecified asthma severity, unspecified whether complicated, unspecified whether persistent -Rx sent to pharmacy on file.  -Patient instructed to contact provider, via mychart, for any difficulties with obtaining prescription. -Informed that provider will change prescription to more common inhaler to avoid delay and pharmacy availability.     Meds:  Meds ordered this encounter  Medications   albuterol (VENTOLIN HFA) 108 (90 Base) MCG/ACT inhaler    Sig: Inhale 2 puffs into the lungs every 6 (six) hours as needed for wheezing or shortness of breath.    Dispense:  8 g    Refill:  2    Order Specific Question:   Supervising Provider    Answer:   Reva Bores [[id number]]   Labs/procedures today:  Lab Orders  No laboratory test(s) ordered today     Reviewed: Preterm labor symptoms and general obstetric precautions including but not limited to vaginal bleeding, contractions, leaking of fluid and fetal movement were reviewed in detail with the patient.  All questions were answered.  Follow-up: Return in about 3 weeks (around 05/25/2021) for LROB.  No orders of the defined types were placed in this encounter.  Cherre Robins MSN, CNM 05/04/2021

## 2021-05-22 ENCOUNTER — Ambulatory Visit: Payer: BLUE CROSS/BLUE SHIELD | Attending: Maternal & Fetal Medicine

## 2021-05-22 ENCOUNTER — Other Ambulatory Visit: Payer: Self-pay | Admitting: Maternal & Fetal Medicine

## 2021-05-22 ENCOUNTER — Encounter: Payer: Self-pay | Admitting: *Deleted

## 2021-05-22 ENCOUNTER — Other Ambulatory Visit: Payer: Self-pay

## 2021-05-22 ENCOUNTER — Ambulatory Visit: Payer: BLUE CROSS/BLUE SHIELD | Admitting: *Deleted

## 2021-05-22 VITALS — BP 99/65 | HR 73

## 2021-05-22 DIAGNOSIS — Z3400 Encounter for supervision of normal first pregnancy, unspecified trimester: Secondary | ICD-10-CM | POA: Insufficient documentation

## 2021-05-22 DIAGNOSIS — Z6791 Unspecified blood type, Rh negative: Secondary | ICD-10-CM | POA: Insufficient documentation

## 2021-05-22 DIAGNOSIS — O280 Abnormal hematological finding on antenatal screening of mother: Secondary | ICD-10-CM | POA: Diagnosis not present

## 2021-05-22 DIAGNOSIS — O26893 Other specified pregnancy related conditions, third trimester: Secondary | ICD-10-CM | POA: Insufficient documentation

## 2021-05-22 DIAGNOSIS — Z148 Genetic carrier of other disease: Secondary | ICD-10-CM | POA: Diagnosis present

## 2021-05-22 DIAGNOSIS — Z3A33 33 weeks gestation of pregnancy: Secondary | ICD-10-CM | POA: Insufficient documentation

## 2021-05-22 DIAGNOSIS — O26899 Other specified pregnancy related conditions, unspecified trimester: Secondary | ICD-10-CM

## 2021-05-25 ENCOUNTER — Ambulatory Visit (INDEPENDENT_AMBULATORY_CARE_PROVIDER_SITE_OTHER): Payer: Medicaid Other

## 2021-05-25 ENCOUNTER — Other Ambulatory Visit (HOSPITAL_COMMUNITY)
Admission: RE | Admit: 2021-05-25 | Discharge: 2021-05-25 | Disposition: A | Discharge status: Home / Self Care | Payer: BLUE CROSS/BLUE SHIELD | Source: Ambulatory Visit

## 2021-05-25 ENCOUNTER — Other Ambulatory Visit: Payer: Self-pay

## 2021-05-25 VITALS — BP 106/72 | HR 105 | Wt 144.0 lb

## 2021-05-25 DIAGNOSIS — N898 Other specified noninflammatory disorders of vagina: Secondary | ICD-10-CM | POA: Insufficient documentation

## 2021-05-25 DIAGNOSIS — Z3400 Encounter for supervision of normal first pregnancy, unspecified trimester: Secondary | ICD-10-CM | POA: Diagnosis present

## 2021-05-25 DIAGNOSIS — Z3A34 34 weeks gestation of pregnancy: Secondary | ICD-10-CM | POA: Insufficient documentation

## 2021-05-25 DIAGNOSIS — Z3403 Encounter for supervision of normal first pregnancy, third trimester: Secondary | ICD-10-CM | POA: Insufficient documentation

## 2021-05-25 MED ORDER — TERCONAZOLE 0.4 % VA CREA: 1.0000 | TOPICAL_CREAM | Freq: Every day | VAGINAL | 0 refills | Status: DC

## 2021-05-25 NOTE — Patient Instructions (Signed)
AREA PEDIATRIC/FAMILY PRACTICE PHYSICIANS ° °[place] ([address]) °[facility] °Chambliss, MD; Eniola, MD; Hale, MD; Hensel, MD; McDiarmid, MD; McIntyer, MD; Neal, MD; Walden, MD °[address] [place], [place] [address] °[phone] °Mon-Fri 8:30-12:30, 1:30-5:00 °Providers come to see babies at [facility] °Accepting Medicaid °[facility] °Limited providers who accept newborns: Koirala, MD; Morrow, MD; Wolters, MD °[address], [place], [place] [address] °[phone] °Mon-Fri 8:00-5:30 °Babies seen by providers at [facility] °Does NOT accept Medicaid °Please call early in hospitalization for appointment (limited availability)  °[facility] °Mulberry, MD °[address] [place], [place] [address] °[phone] °Mon, Tue, Thur, Fri 8:30-5:00, Wed 10:00-7:00 (closed 1-2pm) °Babies seen by [facility] providers °Accepting Medicaid °[facility] °Rubin, MD °[address], [place], [place] [address] °[phone] °Mon-Fri 8:30-5:00, Sat 8:30-12:00 °Provider comes to see babies at [facility] °Accepting Medicaid °Must have been referred from current patients or contacted office prior to delivery °[facility] ([facility]) °Brown, MD; Chandler, MD; Ettefagh, MD; Grant, MD; Lester, MD; McCormick, MD; McQueen, MD; Prose, MD; Simha, MD; Stanley, MD; Stryffeler, NP; Tebben, NP °[address], [place], [place] [address] °[phone] °Mon, Tue, Thur, Fri 8:30-5:30, Wed 9:30-5:30, Sat 8:30-12:30 °Babies seen by [facility] providers °Accepting Medicaid °Only accepting infants of first-time parents or siblings of current patients °Hospital discharge coordinator will make follow-up appointment °Jack Amos °[address], [place], [place]  [address] °[phone]   Fax - [phone] °[facility] °[address], [place], [place]  [address] °Phone - [phone]   Fax - [phone] °Shilpa Gosrani °[address], [place], [place]  [address] °[phone] ° °[place] ([address]) °[facility] °Bates, MD; Brassfield, MD; Cooper, Cox, MD; MD; Davis, MD; Dovico, MD; Ettefaugh, MD; Little, MD; Lowe, MD; Keiffer, MD; Melvin, MD; Sumner, MD; Williams, MD °[address], [place], [place] [address] °[phone] °Mon-Fri 8:30-5:00 (extended evenings Mon-Thur as needed), Sat-Sun 10:00-1:00 °Providers come to see babies at [facility] °Accepting Medicaid for families of first-time babies and families with all children in the household age 3 and under. Must register with office prior to making appointment (M-F only). °[facility] °Henson, NP; Knapp, MD; Lalonde, MD; Tysinger, PA °[address] [place], [place] [address] °[phone] °Mon-Fri 8:00-5:00 °Babies seen by providers at [facility] °Does NOT accept Medicaid/Commercial Insurance Only °[facility] ([facility]  °Artis, MD; Barnes, MD; Bratton, MD; Coccaro, MD; Lockett Gardner, MD; Kramer, MD; Marshall, MD; Netherton, MD; Poleto, MD; Skinner, MD °[address] [place], [place] [address] °[phone] °Mon-Fri 8:30-5:30, Sat (Oct.-Mar.) 9:00-1:00 °Babies seen by providers at [facility] °Accepting Medicaid ° °[place] ([address]) °[facility] °Reid, MD; Warner, MD °[address], [place], [place] [address] °[phone] °Mon-Fri 8:30-5:00, Sat 8:30-12:00 °Providers come to see babies at [facility] °Does NOT accept Medicaid °[facility] °Becker, PA; Hagler, MD; Scifres, PA; Sun, MD; Swayne, MD °[address], [place], [place] [address] °[phone] °Mon-Fri 8:00-5:00 °Babies seen by providers at [facility] °Does NOT accept Medicaid °Only accepting babies of parents who  are patients °Please call early in hospitalization for appointment (limited availability) °[facility] °Clark, MD; Frye, MD; Kelleher, MD; Mack, NP; Miller, MD; O'Keller, MD; Patterson, NP; Pudlo, MD; Puzio, MD; Thomas, MD; Tucker, MD; Twiselton, MD °[address], [place], [place] [address] °[phone] °Mon-Fri 8:00-5:00, Sat 9:00-12:00 °Providers come to see babies at [facility] °Does NOT accept Medicaid ° °[place] ([address]) °[facility] °Limited providers accepting new patients: Brake, NP; Wharton, PA °[address], [place], [place] [address] °[phone] °Mon-Fri 8:00-5:00 °Babies seen by providers at [facility] °Does NOT accept Medicaid °Only accepting babies of parents who are patients °Please call early in hospitalization for appointment (limited availability) °[facility] °Gay, MD; Quinlan, MD °[address] [place], [place] [address] °[phone] (press 1 to schedule appointment) °Mon-Fri 8:00-5:00 °Providers come to see babies at [facility] °Does NOT accept Medicaid °[facility] °Mazer, MD °[address] [place], [place] [address] °[phone] °Mon-Fri 8:30-5:00 (lunch 12:30-1:00), extended hours by appointment only Wed 5:00-6:30 °Babies seen by [facility] providers °Accepting Medicaid °[facility] °Banks, MD; Jordan, MD; Koberlein, MD °[address], [place], [place] [address] °[phone] °Mon-Fri 8:00-5:00 °Babies seen by [facility] providers °Does NOT accept Medicaid °[facility] °Parker, MD; Hunter, MD; Wallace, DO °[address] [place], [place] [address] °[phone] °Mon-Fri 8:00-5:00 °Babies seen by [facility] providers °Does NOT accept Medicaid °[facility] °Brandon, PA; Brecken, PA; Christy, NP; Dees, MD; DeClaire, MD; DeWeese, MD; Hansen, NP; Mills, NP; Parrish, NP; Smoot, NP; Summer, MD; Vapne,  MD °[address] [place], [place] [address] °[phone] °Mon-Fri 8:30-5:00, Sat 10:00-1:00 °Providers come to see babies at [facility] °Does NOT accept Medicaid °Free prenatal information session Tuesdays at 4:45pm °[facility] °Bouska, MD; Gordon, PA; Jeffery, PA; Weber, PA °[address] [place] [address] °[phone] °Mon-Fri 7:30-5:30 °Babies seen by [facility] providers °[facility] °[address], [place], [place]  [address] °[phone]   Fax - [phone] ° °[place] ([address]) °[facility] °Reese, MD °[address] [place], [place] [address] °[phone] °Mon-Thur 8:00-6:00 °Providers come to see babies at [facility] °Accepting Medicaid °[facility] °Anderson, NP; Badger, MD; Beal, PA; Spencer, PA °[address] [place], [place] [address] °[phone] °Mon-Thur 7:30-7:30, Fri 7:30-4:30 °Babies seen by [facility] providers °Accepting Medicaid °[facility] °Agbuya, MD; Klett, NP; Romgoolam, MD °[address], [place], [place] [address] °[phone] °Mon-Fri 8:30-5:00, Sat 8:30-12:00 °Providers come to see babies at [facility] °Accepting Medicaid °Must have “Meet & Greet” appointment at office prior to delivery °[facility] ([facility]) °McCord, MD; Wallace, MD; Wood, MD °[address], [place], [place] [address] °[phone] °Mon-Wed 8:00-6:00, Thur-Fri 8:00-5:00, Sat 9:00-12:00 °Providers come to see babies at [facility] °Does NOT accept Medicaid °Only accepting siblings of current patients °[facility]  °[address], [place], [place]  [address] °[phone]   Fax - [phone] °[facility] °[address], [place], [place]  [address] °[phone]   Fax -  [phone] ° °[place] ([address]) °[facility] °Cirigliano, DO; Matthews, DO °[address] [place], [place] [address] °[phone] °Mon-Fri 7:00-5:00 °Babies seen by [facility] providers °Does NOT accept Medicaid °[facility] °Briscoe, MD; Howley, PA; Moreira, PA °[address], [place], [place] [address] °[phone] °Mon-Fri 8:00-5:00 °Babies seen by [facility] providers °Accepting Medicaid °[facility] °Boyd, MD; Church, PA; Jones, NP; Osborn, PA °[address], [place],  [address] °[phone] °Mon-Fri 8:00-5:00 °Babies seen by providers at [facility] °Accepting Medicaid ° °[place] ([address]) °[facility] [place] °Wendling, DO °[address] [place], [place] [address] °[phone] °Mon-Fri 8:00-5:00 °Babies seen by [facility] providers °Does NOT accept Medicaid °Limited availability, please call early in hospitalization to schedule follow-up °[facility] °Calderon, PA; Cummings, MD; Dillard, MD; Martin, PA; Olson, MD; VanDeven, PA °[address], [place], [place] [address] °[phone] °Mon-Fri 8:30-5:00, Sat 9:00-12:00 °Babies seen by providers at [facility] °Accepting Medicaid °Please register online then schedule online or call office °[email or web address] °[facility] ([facility]) °Hunter, NP; Kumar, MD; Martin Rogers, PA °[address], [place], [place] [address] °[phone] °Mon-Fri 8:00-5:00 °Babies seen by providers at [facility] °Accepting Medicaid °[facility] ([facility]) °Ivanhoe, MD; Kristi Fleenor, NP; West, MD °[address], [place], [place] [address] °[phone] °Mon-Fri 8:00-5:30, Sat&Sun by appointment (phones open at  8:30) °Babies seen by [facility] providers °Accepting Medicaid °Must be a first-time baby or sibling of current patient °[facility] [place]  °[address], [place], [place]  [address] °[phone]   Fax - [phone] ° °[place] ([address]) °[facility] °Brown, PA; Cowen, PA; Rice, MD; Helton, PA; Spry, MD °[address] [place], [place] [address] °[phone] °Mon-Thur 8:00-7:00, Fri 8:00-5:00, Sat 8:00-12:00, Sun 9:00-12:00 °Babies seen by [facility] providers °Accepting Medicaid °[facility] °Coe-Goins, MD; Marshall, MD; Pierre-Louis, MD °[address], [place], [place] [address] °[phone] °Mon-Thur 8:00-5:00 °Babies seen by providers at [facility] °Accepting Medicaid °[facility] °Bratton, MD; Coe-Goins, MD; Hayes, MD; Lewis, MD; List, MD; Lott, MD; Marshall, MD; Moran, MD; O'Neal, MD; Pierre-Louis, MD; Pitonzo, MD; Scholer, MD; Spangle, MD °[address] [place], [place] [address] °[phone] °Mon-Fri 8:00-5:30, Sat (Oct.-Mar.) 9:00-1:00 °Babies seen by providers at [facility] °Accepting Medicaid °Must fill out new patient packet, available online at [email or web address] °[facility] ([facility]) °Friddle, NP; Harris, NP; Kelly, NP; Logan, MD; Melvin, PA; Poth, MD; Ramadoss, MD; Stanton, NP °[address], [place], [place] [address] °[phone] °Mon-Thur 8:00-5:30, Fri 8:00-5:00 °Babies seen by providers at [facility] °Accepting Medicaid ° °[place] ([address]) °[facility] °Dixon, PA; Evening Shade, MD; Pickard, MD; Tapia, PA °[address], [place], [place] [address] °[phone] °Mon-Fri 8:00-5:00 °Babies seen by providers at [facility] °Accepting Medicaid  ° °[place] ([address]) °[facility] [place] °Masneri, DO; Meyers, MD; Nelson, PA °[address], [place], [place] [address] °[phone] °Mon-Fri 8:00-5:00 °Babies seen by providers at [facility] °Does NOT accept Medicaid °Limited appointment availability, please call early in hospitalization ° °[facility] [place] °Kunedd, DO; McGowen, MD °[address], [place], [place] [address] °[phone] °Mon-Fri 8:00-5:00 °Babies seen by [facility] providers °Does NOT accept Medicaid °[facility] [place] °Cameron, MD; MacDonald, MD; Michaels, PA; Nayak, MD °[address] [place] [address], [place], [place] [address] °[phone] °Mon-Fri 8:00-5:00 °After hours clinic ([address] [place], [place] [address]) [phone] Mon-Fri 5:00-8:00, Sat 12:00-6:00, Sun 10:00-4:00 °Babies seen by [facility] providers °Accepting Medicaid °[facility] [place] °[address], [place], [place]  [address] °[phone]   Fax - [phone] ° °[place] ([address]) °[facility] [place] [facility] °Andy, MD °[address], [place], [place] [address] °[phone] °Mon-Fri 8:00-5:00 °Babies seen by [facility] providers °Does NOT accept Medicaid °[facility] [place] ([facility] [place]) °Eksir, MD °[address], [place], [place] [address] °[phone] °Mon-Thur 8:00-7:00, Fri 8:00-5:00, Sat 8:00-12:00 °Babies seen by providers at [facility] °Accepting Medicaid - but does not have vaccinations in office (must be received elsewhere) °Limited availability, please call early in hospitalization ° °[place] ([address]) °[facility]  °Charlene Flemming, MD °[address], [place] [address] °[phone]  Fax [phone] ° °[place] °[facility]  °[facility]  °Kimberly Newton, MD, Annamarie Streilein, PA, Carla Hampton, PA °[address] °[place], [place]  [address] °[phone] °[facility]  °[address], [place], [place] [address] °[phone] °[address], [place], [place] [address] °[phone] (West Office)  °[facility] °[address], [place], [place] [address] °[phone] °[facility] °[address], [place], [place] [address] °[phone] °[facility] °[address], [place], [place] [address] °[phone] °[facility] °[address], [place], [place] [address] °[phone] °[facility] °[address], [place], [place] [address] °[phone] °[facility] °[address], [place], [place] [address] °[phone] °[facility]  °[address], [place], [place] [address] °[phone] °Dr. Robert W. Little °[address], [place], [place] [address] °[phone] °[facility] °[address], [place], [place] [address] °[phone] °[facility] °[address], ,  [address] °[phone] ° °

## 2021-05-25 NOTE — Progress Notes (Signed)
? ?  LOW-RISK PREGNANCY OFFICE VISIT ? ?Patient name: Michelle Turner MRN [id number]  Date of birth: [birth year] ?Chief Complaint:   ?Routine Prenatal Visit ? ?Subjective:   ?Michelle Turner is a 26 y.o. G1P0 female at [redacted]w[redacted]d with an Estimated Date of Delivery: 07/05/21 being seen today for ongoing management of a low-risk pregnancy aeb has Supervision of normal first pregnancy, antepartum and Rh negative state in antepartum period on their problem list. ? ?Patient presents today with, SO, and c/o vaginal irritation.  Patient endorses fetal movement. Patient endorses some abdominal cramping, but no contractions.  Patient denies vaginal concerns including abnormal discharge, leaking of fluid, and bleeding.   ? ?She reports noticing bump yesterday morning and some soreness.  She thinks she may be getting another yeast infection. She reports some suspected labial swelling, but feels it hasn't been a regular throughout the pregnancy. ? ?Contractions: Not present. Vag. Bleeding: None.  Movement: Present. ? ?Reviewed past medical,surgical, social, obstetrical and family history as well as problem list, medications and allergies. ? ?Objective  ? ?Vitals:  ? 05/25/21 1042  ?BP: 106/72  ?Pulse: (!) 105  ?Weight: 144 lb (65.3 kg)  ?Body mass index is 24.72 kg/m?.  ?Total Weight Gain:39 lb (17.7 kg) ? ?  ?     Physical Examination:  ? General appearance: Well appearing, and in no distress ? Mental status: Alert, oriented to person, place, and time ? Skin: Warm & dry ? Cardiovascular: Normal heart rate noted ? Respiratory: Normal respiratory effort, no distress ? Abdomen: Soft, gravid, nontender, AGA with Fundal Height: 34 cm ? Pelvic: Cervical exam performed  ?Visual exam; Mild erythema noted on labia and perineum.  No apparent bumps or lesions.  Questionable ingrown hair palpated at introitus. White plaques noted on labia. CV collected blindly. ?Dilation: Closed Effacement (%): Thick     ? Extremities: Edema: None ? ?Fetal Status: Fetal  Heart Rate (bpm): 145  Movement: Present  ? ?No results found for this or any previous visit (from the past 24 hour(s)).  ?Assessment & Plan:  ?Low-risk pregnancy of a 26 y.o., G1P0 at [redacted]w[redacted]d with an Estimated Date of Delivery: 07/05/21  ? ?1. Supervision of normal first pregnancy, antepartum ?-Anticipatory guidance for upcoming appts. ?-Patient to schedule next appt in 2 weeks for an in-person visit. ?-Educated on GBS bacteria including what it is, why we test, and how and when we treat if needed. ? ?2. [redacted] weeks gestation of pregnancy ?-Doing well. ?-Patient taking virtual CB classes. ?-Addressed questions regarding episiotomy and other labor concerns. ?-Reassured that episiotomy rarely performed.  ? ?3. Vaginal irritation ?-Informed that vaginal exam suspicious for candidiasis. ?-Will send CV. ?-Will also send Terazol cream to pharmacy on file.  ? ?  ?Meds: No orders of the defined types were placed in this encounter. ? ?Labs/procedures today:  ?Lab Orders  ?No laboratory test(s) ordered today  ?  ? ?Reviewed: Preterm labor symptoms and general obstetric precautions including but not limited to vaginal bleeding, contractions, leaking of fluid and fetal movement were reviewed in detail with the patient.  All questions were answered. ? ?Follow-up: No follow-ups on file. ? ?No orders of the defined types were placed in this encounter. ? ?Cherre Robins MSN, CNM ?05/25/2021 ? ?

## 2021-05-25 NOTE — Progress Notes (Signed)
Pt presents for ROB reports a tiny "sensitive" bump at the opening of her vagina and her labia is swollen. ? ?

## 2021-05-26 LAB — CERVICOVAGINAL ANCILLARY ONLY
Bacterial Vaginitis (gardnerella): NEGATIVE
Candida Glabrata: NEGATIVE
Candida Vaginitis: NEGATIVE
Comment: NEGATIVE
Comment: NEGATIVE
Comment: NEGATIVE

## 2021-06-08 ENCOUNTER — Encounter: Payer: Self-pay | Admitting: Family Medicine

## 2021-06-08 ENCOUNTER — Other Ambulatory Visit: Payer: Self-pay

## 2021-06-08 ENCOUNTER — Ambulatory Visit (INDEPENDENT_AMBULATORY_CARE_PROVIDER_SITE_OTHER): Payer: Medicaid Other | Admitting: Family Medicine

## 2021-06-08 VITALS — BP 111/74 | HR 104 | Wt 145.0 lb

## 2021-06-08 DIAGNOSIS — O26893 Other specified pregnancy related conditions, third trimester: Secondary | ICD-10-CM

## 2021-06-08 DIAGNOSIS — Z6791 Unspecified blood type, Rh negative: Secondary | ICD-10-CM

## 2021-06-08 DIAGNOSIS — Z3A36 36 weeks gestation of pregnancy: Secondary | ICD-10-CM

## 2021-06-08 NOTE — Progress Notes (Signed)
? ?  PRENATAL VISIT NOTE ? ?Subjective:  ?Michelle Turner is a 26 y.o. G1P0 at [redacted]w[redacted]d being seen today for ongoing prenatal care.  She is currently monitored for the following issues for this low-risk pregnancy and has Supervision of normal first pregnancy, antepartum and Rh negative state in antepartum period on their problem list. ? ?Patient reports no complaints.  Contractions: Not present. Vag. Bleeding: None.  Movement: Present. Denies leaking of fluid.  ? ?The following portions of the patient's history were reviewed and updated as appropriate: allergies, current medications, past family history, past medical history, past social history, past surgical history and problem list.  ? ?Objective:  ? ?Vitals:  ? 06/08/21 1334  ?BP: 111/74  ?Pulse: (!) 104  ?Weight: 145 lb (65.8 kg)  ? ? ?Fetal Status: Fetal Heart Rate (bpm): 148 Fundal Height: 36 cm Movement: Present ? ?General:  Alert, oriented and cooperative. Patient is in no acute distress.  ?Skin: Skin is warm and dry. No rash noted.   ?Cardiovascular: Normal heart rate noted.  ?Respiratory: Normal respiratory effort, no problems with respiration noted.  ?Abdomen: Soft, gravid, appropriate for gestational age.       ?Pelvic: Cervical exam deferred. Chaperone present for GBS culture. Normal external genitalia, normal vaginal discharge present.   ?Extremities: Normal range of motion.  Edema: None.  ?Mental Status: Normal mood and affect. Normal behavior. Normal judgment and thought content.  ? ?Assessment and Plan:  ?Pregnancy: G1P0 at [redacted]w[redacted]d ? ?1. Supervision of normal first pregnancy, antepartum ?2. [redacted] weeks gestation of pregnancy ?Progressing well. FH and FHT within normal limits. GBS swab collected today. Will follow up results. Labor precautions reviewed. Return for follow up visit in 1 week.  ?- Culture, beta strep (group b only) ? ?3. Rh negative state in antepartum period ?S/p Rhogam on 04/19/21. ? ?Preterm labor symptoms and general obstetric precautions  including but not limited to vaginal bleeding, contractions, leaking of fluid and fetal movement were reviewed in detail with the patient. ? ?Please refer to After Visit Summary for other counseling recommendations.  ? ?Return in about 1 week (around 06/15/2021) for follow up OB visit. ? ?Worthy Rancher, MD ? ?

## 2021-06-08 NOTE — Progress Notes (Signed)
ROB/GBS. Reports no problems today. 

## 2021-06-12 LAB — CULTURE, BETA STREP (GROUP B ONLY): Strep Gp B Culture: NEGATIVE

## 2021-06-15 ENCOUNTER — Other Ambulatory Visit: Payer: Self-pay

## 2021-06-15 ENCOUNTER — Encounter: Payer: Self-pay | Admitting: Obstetrics

## 2021-06-15 ENCOUNTER — Ambulatory Visit (INDEPENDENT_AMBULATORY_CARE_PROVIDER_SITE_OTHER): Payer: BLUE CROSS/BLUE SHIELD | Admitting: Obstetrics

## 2021-06-15 VITALS — BP 101/68 | HR 93 | Wt 146.4 lb

## 2021-06-15 DIAGNOSIS — Z6791 Unspecified blood type, Rh negative: Secondary | ICD-10-CM

## 2021-06-15 DIAGNOSIS — Z3400 Encounter for supervision of normal first pregnancy, unspecified trimester: Secondary | ICD-10-CM

## 2021-06-15 DIAGNOSIS — O26899 Other specified pregnancy related conditions, unspecified trimester: Secondary | ICD-10-CM

## 2021-06-15 NOTE — Progress Notes (Signed)
Pt in office for ROB visit. She does not have any concerns today.  ?

## 2021-06-15 NOTE — Progress Notes (Signed)
Subjective:  ?Michelle Turner is a 26 y.o. G1P0 at [redacted]w[redacted]d being seen today for ongoing prenatal care.  She is currently monitored for the following issues for this low-risk pregnancy and has Supervision of normal first pregnancy, antepartum and Rh negative state in antepartum period on their problem list. ? ?Patient reports no complaints.  Contractions: Not present. Vag. Bleeding: None.  Movement: Present. Denies leaking of fluid.  ? ?The following portions of the patient's history were reviewed and updated as appropriate: allergies, current medications, past family history, past medical history, past social history, past surgical history and problem list. Problem list updated. ? ?Objective:  ? ?Vitals:  ? 06/15/21 1344  ?BP: 101/68  ?Pulse: 93  ?Weight: 146 lb 6.4 oz (66.4 kg)  ? ? ?Fetal Status: Fetal Heart Rate (bpm): 143   Movement: Present    ? ?General:  Alert, oriented and cooperative. Patient is in no acute distress.  ?Skin: Skin is warm and dry. No rash noted.   ?Cardiovascular: Normal heart rate noted  ?Respiratory: Normal respiratory effort, no problems with respiration noted  ?Abdomen: Soft, gravid, appropriate for gestational age. Pain/Pressure: Present     ?Pelvic:  Cervical exam deferred        ?Extremities: Normal range of motion.  Edema: None  ?Mental Status: Normal mood and affect. Normal behavior. Normal judgment and thought content.  ? ?Urinalysis:     ? ?Assessment and Plan:  ?Pregnancy: G1P0 at [redacted]w[redacted]d ? ?1. Supervision of normal first pregnancy, antepartum ? ?2. Rh negative state in antepartum period ?- Rhogam postpartum ? ? ?Term labor symptoms and general obstetric precautions including but not limited to vaginal bleeding, contractions, leaking of fluid and fetal movement were reviewed in detail with the patient. ?Please refer to After Visit Summary for other counseling recommendations.  ? ?Return in about 1 week (around 06/22/2021) for ROB. ? ? ?Brock Bad, MD  ?06/15/21  ?

## 2021-06-21 ENCOUNTER — Encounter: Payer: Self-pay | Admitting: Family Medicine

## 2021-06-21 ENCOUNTER — Ambulatory Visit (INDEPENDENT_AMBULATORY_CARE_PROVIDER_SITE_OTHER): Payer: BLUE CROSS/BLUE SHIELD | Admitting: Family Medicine

## 2021-06-21 ENCOUNTER — Encounter: Payer: BLUE CROSS/BLUE SHIELD | Admitting: Family Medicine

## 2021-06-21 VITALS — BP 104/69 | HR 101 | Wt 147.0 lb

## 2021-06-21 DIAGNOSIS — Z3A38 38 weeks gestation of pregnancy: Secondary | ICD-10-CM

## 2021-06-21 DIAGNOSIS — Z3400 Encounter for supervision of normal first pregnancy, unspecified trimester: Secondary | ICD-10-CM

## 2021-06-21 DIAGNOSIS — Z6791 Unspecified blood type, Rh negative: Secondary | ICD-10-CM

## 2021-06-21 DIAGNOSIS — O26899 Other specified pregnancy related conditions, unspecified trimester: Secondary | ICD-10-CM

## 2021-06-21 DIAGNOSIS — M25559 Pain in unspecified hip: Secondary | ICD-10-CM

## 2021-06-21 NOTE — Progress Notes (Signed)
Patient presents for ROB. Patient complains of having severe hip pain. Patient has no other concerns. ?

## 2021-06-21 NOTE — Progress Notes (Signed)
? ? ?  Subjective:  ?Michelle Turner is a 26 y.o. G1P0 at [redacted]w[redacted]d being seen today for ongoing prenatal care.  She is currently monitored for the following issues for this low-risk pregnancy and has Supervision of normal first pregnancy, antepartum and Rh negative state in antepartum period on their problem list. ? ?Patient reports  right hip/buttock pain . Hurts more with certain positions, impacted her ability to do her prenatal yoga recently. She has tried tylenol that did help but doesn't want to do this frequently. Denies numbness/weakness or difficulty walking. Worried about pushing/labor with it.  Contractions: Irritability. Vag. Bleeding: None.  Movement: Present. Denies leaking of fluid.  ? ?The following portions of the patient's history were reviewed and updated as appropriate: allergies, current medications, past family history, past medical history, past social history, past surgical history and problem list.  ? ?Objective:  ? ?Vitals:  ? 06/21/21 1445  ?BP: 104/69  ?Pulse: (!) 101  ?Weight: 147 lb (66.7 kg)  ? ? ?Fetal Status: Fetal Heart Rate (bpm): 104 Fundal Height: 37 cm Movement: Present    ? ?General:  Alert, oriented and cooperative. Patient is in no acute distress.  ?Skin: Skin is warm and dry. No rash noted.   ?Cardiovascular: Normal heart rate noted  ?Respiratory: Normal respiratory effort, no problems with respiration noted  ?Abdomen: Soft, gravid, appropriate for gestational age. Pain/Pressure: Present     ?Pelvic:  Cervical exam deferred        ?Extremities:  + piriformis tenderness on the right. R ASIS inferior compared to left. Non-tender to palpation around SI or hip joints. Normal gait.   ?Mental Status: Normal mood and affect. Normal behavior. Normal judgment and thought content.  ? ?Assessment and Plan:  ?Pregnancy: G1P0 at [redacted]w[redacted]d ? ?1. Supervision of normal first pregnancy, antepartum ?Doing well with normal fetal movement.  ? ?2. [redacted] weeks gestation of pregnancy ?Plan for cervical check at  next visit.  ? ?3. Rh negative state in antepartum period ?Already received RhoGAM. Plan for eval pp.  ? ?4. Hip pain ?Right sided around buttock, joint, and anterior pelvic/round ligament region. Likely multifactorial. Showed piriformis and anterior hip flexor stretches. May use ice/heat in these areas, tylenol when needed. Provided reassurance that we can do several different positions in labor that irritate her hip the least. Red flags discussed (numbness, weakness etc).  ? ?Term labor symptoms and general obstetric precautions including but not limited to vaginal bleeding, contractions, leaking of fluid and fetal movement were reviewed in detail with the patient. ?Please refer to After Visit Summary for other counseling recommendations.  ? ?Return in about 1 week (around 06/28/2021) for LROB. ? ? ?Allayne Stack, DO ?

## 2021-06-27 ENCOUNTER — Inpatient Hospital Stay (HOSPITAL_COMMUNITY)
Admission: AD | Admit: 2021-06-27 | Payer: BLUE CROSS/BLUE SHIELD | Source: Home / Self Care | Admitting: Family Medicine

## 2021-06-29 ENCOUNTER — Telehealth (HOSPITAL_COMMUNITY): Payer: Self-pay | Admitting: *Deleted

## 2021-06-29 ENCOUNTER — Ambulatory Visit (INDEPENDENT_AMBULATORY_CARE_PROVIDER_SITE_OTHER): Payer: BLUE CROSS/BLUE SHIELD | Admitting: Obstetrics and Gynecology

## 2021-06-29 ENCOUNTER — Encounter (HOSPITAL_COMMUNITY): Payer: Self-pay | Admitting: *Deleted

## 2021-06-29 ENCOUNTER — Encounter: Payer: Self-pay | Admitting: Obstetrics and Gynecology

## 2021-06-29 VITALS — BP 105/70 | HR 89 | Wt 144.0 lb

## 2021-06-29 DIAGNOSIS — Z3400 Encounter for supervision of normal first pregnancy, unspecified trimester: Secondary | ICD-10-CM

## 2021-06-29 DIAGNOSIS — Z3A39 39 weeks gestation of pregnancy: Secondary | ICD-10-CM

## 2021-06-29 NOTE — Progress Notes (Signed)
? ?  LOW-RISK PREGNANCY OFFICE VISIT ?Patient name: Michelle Turner MRN [id number]  Date of birth: Aug 24, 1995 ?Chief Complaint:   ?Routine Prenatal Visit ? ?History of Present Illness:   ?Michelle Turner is a 26 y.o. G1P0 female at [redacted]w[redacted]d with an Estimated Date of Delivery: 07/05/21 being seen today for ongoing management of a low-risk pregnancy.  ?Today she reports occasional contractions and increased pelvic pressure . Contractions: Irritability. Vag. Bleeding: None.  Movement: Present. denies leaking of fluid. ?Review of Systems:   ?Pertinent items are noted in HPI ?Denies abnormal vaginal discharge w/ itching/odor/irritation, headaches, visual changes, shortness of breath, chest pain, abdominal pain, severe nausea/vomiting, or problems with urination or bowel movements unless otherwise stated above. ?Pertinent History Reviewed:  ?Reviewed past medical,surgical, social, obstetrical and family history.  ?Reviewed problem list, medications and allergies. ?Physical Assessment:  ? ?Vitals:  ? 06/29/21 0946  ?BP: 105/70  ?Pulse: 89  ?Weight: 144 lb (65.3 kg)  ?Body mass index is 24.72 kg/m?. ?  ?     Physical Examination:  ? General appearance: Well appearing, and in no distress ? Mental status: Alert, oriented to person, place, and time ? Skin: Warm & dry ? Cardiovascular: Normal heart rate noted ? Respiratory: Normal respiratory effort, no distress ? Abdomen: Soft, gravid, nontender ? Pelvic: Cervical exam performed        ? Extremities: Edema: None ? ?Fetal Status: Fetal Heart Rate (bpm): 152   Movement: Present   ? ?No results found for this or any previous visit (from the past 24 hour(s)).  ?Assessment & Plan:  ?1) Low-risk pregnancy G1P0 at [redacted]w[redacted]d with an Estimated Date of Delivery: 07/05/21  ? ?2) Supervision of normal first pregnancy, antepartum ?- eIOL scheduled for 07/12/2021 ?- IOL orders placed in Epic ? ?3) [redacted] weeks gestation of pregnancy  ?  ?Meds: No orders of the defined types were placed in this  encounter. ? ?Labs/procedures today: cervical exam ? ?Plan:  Continue routine obstetrical care  ? ?Reviewed: Term labor symptoms and general obstetric precautions including but not limited to vaginal bleeding, contractions, leaking of fluid and fetal movement were reviewed in detail with the patient.  All questions were answered. Has home bp cuff.  Check bp weekly, let us know if >140/90.  ? ?Follow-up: Return in about 1 week (around 07/06/2021) for Return OB visit. ? ?No orders of the defined types were placed in this encounter. ? ?Raelyn Mora MSN, CNM ?06/29/2021 ?10:09 AM ?

## 2021-06-29 NOTE — Telephone Encounter (Signed)
Preadmission screen  

## 2021-06-29 NOTE — Progress Notes (Signed)
Pt reports fetal movement with some cramping. 

## 2021-07-06 ENCOUNTER — Ambulatory Visit (INDEPENDENT_AMBULATORY_CARE_PROVIDER_SITE_OTHER): Payer: BLUE CROSS/BLUE SHIELD

## 2021-07-06 VITALS — BP 107/73 | HR 86 | Wt 147.0 lb

## 2021-07-06 DIAGNOSIS — Z6791 Unspecified blood type, Rh negative: Secondary | ICD-10-CM

## 2021-07-06 DIAGNOSIS — O26899 Other specified pregnancy related conditions, unspecified trimester: Secondary | ICD-10-CM

## 2021-07-06 DIAGNOSIS — Z3400 Encounter for supervision of normal first pregnancy, unspecified trimester: Secondary | ICD-10-CM

## 2021-07-06 DIAGNOSIS — Z3A40 40 weeks gestation of pregnancy: Secondary | ICD-10-CM

## 2021-07-06 NOTE — Progress Notes (Signed)
? ?  PRENATAL VISIT NOTE ? ?Subjective:  ?Michelle Turner is a 26 y.o. G1P0 at [redacted]w[redacted]d being seen today for ongoing prenatal care.  She is currently monitored for the following issues for this low-risk pregnancy and has Supervision of normal first pregnancy, antepartum and Rh negative state in antepartum period on their problem list. ? ?Patient reports no acute concerns. Reports increase in nausea and overall not feeling well today. No vomiting, fever, or diarrhea. Contractions: Irritability. Vag. Bleeding: None.  Movement: Present. Denies leaking of fluid.  ? ?The following portions of the patient's history were reviewed and updated as appropriate: allergies, current medications, past family history, past medical history, past social history, past surgical history and problem list.  ? ?Objective:  ? ?Vitals:  ? 07/06/21 1456  ?BP: 107/73  ?Pulse: 86  ?Weight: 147 lb (66.7 kg)  ? ? ?Fetal Status: Fetal Heart Rate (bpm): NST   Movement: Present    ? ?General:  Alert, oriented and cooperative. Patient is in no acute distress.  ?Skin: Skin is warm and dry. No rash noted.   ?Cardiovascular: Normal heart rate noted  ?Respiratory: Normal respiratory effort, no problems with respiration noted  ?Abdomen: Soft, gravid, appropriate for gestational age.  Pain/Pressure: Absent     ?Pelvic: Cervical exam deferred        ?Extremities: Normal range of motion.  Edema: None  ?Mental Status: Normal mood and affect. Normal behavior. Normal judgment and thought content.  ? ?Assessment and Plan:  ?Pregnancy: G1P0 at [redacted]w[redacted]d ?1. Supervision of normal first pregnancy, antepartum ?- Routine OB. Doing well, no acute concerns ?- Labor precautions reviewed ?- IOL scheduled on 4/19 at 41 weeks ? ?2. [redacted] weeks gestation of pregnancy ?- NST today: reactive and reassuring ?- FHR: 135 bpm, moderate variability, +15x15 accels, no decels ? Toco; ui ? ?3. Rh negative state in antepartum period ?- S/p Rhogam ? ? ?Term labor symptoms and general obstetric  precautions including but not limited to vaginal bleeding, contractions, leaking of fluid and fetal movement were reviewed in detail with the patient. ?Please refer to After Visit Summary for other counseling recommendations.  ? ?No follow-ups on file. ? ?Future Appointments  ?Date Time Provider [place]  ?07/12/2021  7:15 AM [facility] SCHED ROOM [facility] None  ? ? ?Renee Harder, CNM ? ?

## 2021-07-06 NOTE — Addendum Note (Signed)
Addended by: Maretta Bees on: 07/06/2021 03:51 PM ? ? Modules accepted: Orders ? ?

## 2021-07-10 ENCOUNTER — Other Ambulatory Visit: Payer: Self-pay | Admitting: Family Medicine

## 2021-07-10 ENCOUNTER — Telehealth (HOSPITAL_COMMUNITY): Payer: Self-pay | Admitting: *Deleted

## 2021-07-10 NOTE — Telephone Encounter (Signed)
Preadmission screen  

## 2021-07-12 ENCOUNTER — Inpatient Hospital Stay (HOSPITAL_COMMUNITY): Payer: BLUE CROSS/BLUE SHIELD

## 2021-07-12 ENCOUNTER — Inpatient Hospital Stay (HOSPITAL_COMMUNITY)
Admission: AD | Admit: 2021-07-12 | Discharge: 2021-07-14 | DRG: 807 | Disposition: A | Discharge status: Home / Self Care | Payer: BLUE CROSS/BLUE SHIELD | Attending: Obstetrics and Gynecology | Admitting: Obstetrics and Gynecology

## 2021-07-12 ENCOUNTER — Encounter (HOSPITAL_COMMUNITY): Payer: Self-pay | Admitting: Family Medicine

## 2021-07-12 DIAGNOSIS — Z6791 Unspecified blood type, Rh negative: Secondary | ICD-10-CM | POA: Diagnosis not present

## 2021-07-12 DIAGNOSIS — O26893 Other specified pregnancy related conditions, third trimester: Secondary | ICD-10-CM | POA: Diagnosis present

## 2021-07-12 DIAGNOSIS — Z3A41 41 weeks gestation of pregnancy: Secondary | ICD-10-CM

## 2021-07-12 DIAGNOSIS — Z3400 Encounter for supervision of normal first pregnancy, unspecified trimester: Principal | ICD-10-CM

## 2021-07-12 DIAGNOSIS — O480 Post-term pregnancy: Secondary | ICD-10-CM | POA: Diagnosis present

## 2021-07-12 DIAGNOSIS — O26899 Other specified pregnancy related conditions, unspecified trimester: Secondary | ICD-10-CM

## 2021-07-12 LAB — CBC
HCT: 37.2 % (ref 36.0–46.0)
Hemoglobin: 13.4 g/dL (ref 12.0–15.0)
MCH: 30.2 pg (ref 26.0–34.0)
MCHC: 36 g/dL (ref 30.0–36.0)
MCV: 84 fL (ref 80.0–100.0)
Platelets: 247 10*3/uL (ref 150–400)
RBC: 4.43 MIL/uL (ref 3.87–5.11)
RDW: 13.7 % (ref 11.5–15.5)
WBC: 9.8 10*3/uL (ref 4.0–10.5)
nRBC: 0 % (ref 0.0–0.2)

## 2021-07-12 LAB — TYPE AND SCREEN
ABO/RH(D): O NEG
Antibody Screen: POSITIVE

## 2021-07-12 LAB — RPR: RPR Ser Ql: NONREACTIVE

## 2021-07-12 MED ORDER — MISOPROSTOL 50MCG HALF TABLET
50.0000 ug | ORAL_TABLET | ORAL | Status: DC | PRN
  Administered 2021-07-12 (×2): 50 ug via BUCCAL
  Filled 2021-07-12 (×2): qty 1

## 2021-07-12 MED ORDER — SOD CITRATE-CITRIC ACID 500-334 MG/5ML PO SOLN: 30.0000 mL | ORAL | Status: DC | PRN

## 2021-07-12 MED ORDER — OXYTOCIN 10 UNIT/ML IJ SOLN: 10.0000 [IU] | Freq: Once | INTRAMUSCULAR | Status: DC | PRN

## 2021-07-12 MED ORDER — OXYTOCIN BOLUS FROM INFUSION
333.0000 mL | Freq: Once | INTRAVENOUS | Status: AC
  Administered 2021-07-13: 333 mL via INTRAVENOUS

## 2021-07-12 MED ORDER — LACTATED RINGERS IV SOLN: INTRAVENOUS | Status: DC

## 2021-07-12 MED ORDER — OXYTOCIN-SODIUM CHLORIDE 30-0.9 UT/500ML-% IV SOLN
2.5000 [IU]/h | INTRAVENOUS | Status: DC
  Administered 2021-07-13: 2.5 [IU]/h via INTRAVENOUS
  Filled 2021-07-12: qty 500

## 2021-07-12 MED ORDER — ACETAMINOPHEN 325 MG PO TABS: 650.0000 mg | ORAL_TABLET | ORAL | Status: DC | PRN

## 2021-07-12 MED ORDER — LIDOCAINE HCL (PF) 1 % IJ SOLN
30.0000 mL | INTRAMUSCULAR | Status: AC | PRN
  Administered 2021-07-13: 30 mL via SUBCUTANEOUS
  Filled 2021-07-12: qty 30

## 2021-07-12 MED ORDER — ONDANSETRON HCL 4 MG/2ML IJ SOLN
4.0000 mg | Freq: Four times a day (QID) | INTRAMUSCULAR | Status: DC | PRN
  Administered 2021-07-12: 4 mg via INTRAVENOUS
  Filled 2021-07-12: qty 2

## 2021-07-12 MED ORDER — FENTANYL CITRATE (PF) 100 MCG/2ML IJ SOLN
100.0000 ug | INTRAMUSCULAR | Status: DC | PRN
  Administered 2021-07-13 (×2): 100 ug via INTRAVENOUS
  Filled 2021-07-12 (×2): qty 2

## 2021-07-12 MED ORDER — OXYTOCIN-SODIUM CHLORIDE 30-0.9 UT/500ML-% IV SOLN: 1.0000 m[IU]/min | INTRAVENOUS | Status: DC

## 2021-07-12 MED ORDER — LACTATED RINGERS IV SOLN: 500.0000 mL | INTRAVENOUS | Status: DC | PRN

## 2021-07-12 MED ORDER — OXYCODONE-ACETAMINOPHEN 5-325 MG PO TABS: 2.0000 | ORAL_TABLET | ORAL | Status: DC | PRN

## 2021-07-12 MED ORDER — TERBUTALINE SULFATE 1 MG/ML IJ SOLN: 0.2500 mg | Freq: Once | INTRAMUSCULAR | Status: DC | PRN

## 2021-07-12 MED ORDER — OXYCODONE-ACETAMINOPHEN 5-325 MG PO TABS: 1.0000 | ORAL_TABLET | ORAL | Status: DC | PRN

## 2021-07-12 NOTE — H&P (Addendum)
OBSTETRIC ADMISSION HISTORY AND PHYSICAL ? ?Michelle Turner is a 26 y.o. female G1P0 with IUP at [redacted]w[redacted]d by LMP presenting for IOL for PD. She reports +FMs, No LOF, no VB, no blurry vision, headaches or peripheral edema, and RUQ pain.  She plans on breast feeding. She request ParaGard for birth control. ?She received her prenatal care at  [facility]   ? ?Dating: By LMP --->  Estimated Date of Delivery: 07/05/21 ? ?Prenatal History/Complications: low risk pregnancy ? ?Past Medical History: ?Past Medical History:  ?Diagnosis Date  ? Anorexia   ? Anxiety   ? Asthma   ? Migraines   ? ? ?Past Surgical History: ?Past Surgical History:  ?Procedure Laterality Date  ? LUMBAR PUNCTURE    ? ? ?Obstetrical History: ?OB History   ? ? Gravida  ?1  ? Para  ?   ? Term  ?   ? Preterm  ?   ? AB  ?   ? Living  ?   ?  ? ? SAB  ?   ? IAB  ?   ? Ectopic  ?   ? Multiple  ?   ? Live Births  ?   ?   ?  ?  ? ? ?Social History ?Social History  ? ?Socioeconomic History  ? Marital status: Single  ?  Spouse name: Not on file  ? Number of children: Not on file  ? Years of education: Not on file  ? Highest education level: Not on file  ?Occupational History  ? Not on file  ?Tobacco Use  ? Smoking status: Never  ? Smokeless tobacco: Never  ?Vaping Use  ? Vaping Use: Former  ?Substance and Sexual Activity  ? Alcohol use: Not Currently  ? Drug use: Not Currently  ?  Types: Marijuana  ? Sexual activity: Yes  ?Other Topics Concern  ? Not on file  ?Social History Narrative  ? Not on file  ? ?Social Determinants of Health  ? ?Financial Resource Strain: Not on file  ?Food Insecurity: Not on file  ?Transportation Needs: Not on file  ?Physical Activity: Not on file  ?Stress: Not on file  ?Social Connections: Not on file  ? ? ?Family History: ?Family History  ?Problem Relation Age of Onset  ? Hypertension Mother   ? ? ?Allergies: ?No Known Allergies ? ?Medications Prior to Admission  ?Medication Sig Dispense Refill Last Dose  ? albuterol (VENTOLIN HFA) 108 (90 Base)  MCG/ACT inhaler Inhale 2 puffs into the lungs every 6 (six) hours as needed for wheezing or shortness of breath. 8 g 2   ? Elastic Bandages & Supports (COMFORT FIT MATERNITY SUPP MED) MISC Wear daily when ambulating (Patient not taking: Reported on 05/25/2021) 1 each 0   ? Prenatal Vit-Fe Fumarate-FA (MULTIVITAMIN-PRENATAL) 27-0.8 MG TABS tablet Take 1 tablet by mouth daily at 12 noon.     ? ? ? ?Review of Systems  ? ?All systems reviewed and negative except as stated in HPI ? ?Blood pressure 110/69, pulse 85, temperature 98.2 ?F (36.8 ?C), temperature source Oral, resp. rate 18, height 5\' 4"  (1.626 m), weight 67.1 kg, last menstrual period 09/28/2020. ?General appearance: alert, cooperative, appears stated age, and no distress ?Lungs: clear to auscultation bilaterally ?Heart: regular rate and rhythm ?Abdomen: soft, non-tender; bowel sounds normal ?Pelvic: WNL ?Extremities: Homans sign is negative, no sign of DVT ?Presentation: cephalic ?Fetal monitoringBaseline: 145 bpm, Variability: Good {> 6 bpm), Accelerations: present; 15x15, and Decelerations: Absent ?Uterine activity irregular ?Dilation:  Closed ?Effacement (%): 50 ?Station: -3 ?Exam by:: Agbaza CNM student ? ? ?Prenatal labs: ?ABO, Rh: --/--/PENDING (04/19 1051) ?Antibody: PENDING (04/19 1051) ?Rubella: Immune (09/26 0000) ?RPR: Non Reactive (01/25 0912)  ?HBsAg: Negative (09/26 0000)  ?HIV: Non Reactive (01/25 0912)  ?GBS: Negative/-- (03/16 1408)  ?1 hr Glucola normal ?Genetic screening  normal ?Anatomy US normal ? ? ?Nursing Staff Provider  ?Office Location  [facility]) Dating  LMP c/w 11 wk sono  ?Language   english Anatomy US  normal  ?Flu Vaccine  03/22/2021 Genetic/Carrier Screen  NIPS:   Low risks ?AFP:   neg ?Horizon:neg x 4  ?TDaP Vaccine  05/04/21 Hgb A1C or  ?GTT Early A1c 5.1 ?Third trimester   ?COVID Vaccine  yes - 07-29-2019   LAB RESULTS   ?Rhogam  04/19/2021 Blood Type O/Negative/-- (09/26 0000)   ?Baby Feeding Plan  breast  Antibody Negative (09/26 0000)  ?Contraception  IUD Rubella Immune (09/26 0000)  ?Circumcision n/a RPR Non Reactive (01/25 0912)   ?Pediatrician   unsure HBsAg Negative (09/26 0000)   ?Support Person  sig other HCVAb neg  ?Prenatal Classes  HIV Non Reactive (01/25 0912)     ?BTL Consent  GBS Negative  ?VBAC Consent  Pap   ?     ?DME Rx [no ] BP cuff ?[x ] Weight Scale Waterbirth  [ ]  Class [ ]  Consent [ ]  CNM visit  ?PHQ9 & GAD7 [ x ] new OB ?[name][X ] 28 weeks  ?[x ] 36 weeks Induction  [ ]  Orders Entered [ ] Foley Y/N  ? ? ? ?Prenatal Transfer Tool  ?Maternal Diabetes: No ?Genetic Screening: Normal ?Maternal Ultrasounds/Referrals: Normal ?Fetal Ultrasounds or other Referrals:  None ?Maternal Substance Abuse:  No ?Significant Maternal Medications:  None ?Significant Maternal Lab Results: Group B Strep negative and Rh negative ? ?Results for orders placed or performed during the hospital encounter of 07/12/21 (from the past 24 hour(s))  ?CBC  ? Collection Time: 07/12/21 10:51 AM  ?Result Value Ref Range  ? WBC 9.8 4.0 - 10.5 K/uL  ? RBC 4.43 3.87 - 5.11 MIL/uL  ? Hemoglobin 13.4 12.0 - 15.0 g/dL  ? HCT 37.2 36.0 - 46.0 %  ? MCV 84.0 80.0 - 100.0 fL  ? MCH 30.2 26.0 - 34.0 pg  ? MCHC 36.0 30.0 - 36.0 g/dL  ? RDW 13.7 11.5 - 15.5 %  ? Platelets 247 150 - 400 K/uL  ? nRBC 0.0 0.0 - 0.2 %  ?Type and screen  ? Collection Time: 07/12/21 10:51 AM  ?Result Value Ref Range  ? ABO/RH(D) PENDING   ? Antibody Screen PENDING   ? Sample Expiration    ?  07/15/2021,2359 ?Performed at [facility], [address] [place], [place] [id number] ?  ? ? ?Patient Active Problem List  ? Diagnosis Date Noted  ? Indication for care in labor or delivery 07/12/2021  ? Supervision of normal first pregnancy, antepartum 02/23/2021  ? Rh negative state in antepartum period 02/23/2021  ? ? ?Assessment/Plan:  ?Michelle Turner is a 26 y.o. G1P0 at 3940w0d here for PD IOL ? ?#Labor:cytotec ?#Pain: Natural laboring techniques ?#FWB: Cat 1 ?#ID:  N/a ?#MOF:  breast ?#MOC: ParaGard ?#Circ:  N/a ? ?Onome S Agbaza, Student-MidWife  ?07/12/2021, 12:55 PM ? ? Attestation of Supervision of Student:  I confirm that I have verified the information documented in the nurse midwife student?s note and that I have also personally  supervised  and coordinated  the history, physical exam and all medical decision making activities.  I have verified that all services and findings are accurately documented in this student's note; and I agree with management and plan as outlined in the documentation. I have also made any necessary editorial changes. ? ? ?Calvert Cantor, CNM ?[facility] for [employer] ?07/12/2021 4:36 PM ? ?

## 2021-07-12 NOTE — Progress Notes (Signed)
Michelle Turner is a 26 y.o. G1P0 at [redacted]w[redacted]d admitted for  PDIOL ? ?Subjective: ?Patient sitting on birthing ball at bedside. Reviewed foley balloon placement, feeling contractions some, but not uncomfortable and denies any pain management at this time. She consents to sterile vag exam and foley balloon procedure. Patient stated pinching right pelvic pain prior to foley balloon placement, legs readjusted. She stated pressure during foley balloon placement. She is breathing through the contractions and pressure post foley balloon placement. IV medication available if needed.  ? ?Objective: ?BP 123/80   Pulse 63   Temp 98.2 ?F (36.8 ?C) (Oral)   Resp 18   Ht 5\' 4"  (1.626 m)   Wt 67.1 kg   LMP 09/28/2020   BMI 25.40 kg/m?  ?No intake/output data recorded. ? ?FHR baseline 140 bpm, Variability: moderate, Accelerations:present, Decelerations:  Absent ?Toco: regular, every 1-2 minutes  ? ?SVE:   Dilation: 1 ?Effacement (%): 30 ?Station: -2 ?Exam by:: [email or web address], CNM ? ? ? ?Labs: ?Lab Results  ?Component Value Date  ? WBC 9.8 07/12/2021  ? HGB 13.4 07/12/2021  ? HCT 37.2 07/12/2021  ? MCV 84.0 07/12/2021  ? PLT 247 07/12/2021  ? ? ?Assessment / Plan: ?Foley balloon placement, hold dose of Cytotec. ?Free movement ?Pain management: IV medication if needed ? ?Labor: early ?Fetal Wellbeing:  Category I ?Pain Control:  labor support without medications ?Pre-eclampsia: N/A ?I/D:  GBS neg ?Anticipated MOD: hopeful for vaginal birth ? ?07/14/2021 Keevon Henney, SNM ?07/12/2021, 9:15 PM  ?

## 2021-07-13 ENCOUNTER — Other Ambulatory Visit: Payer: Self-pay

## 2021-07-13 ENCOUNTER — Encounter (HOSPITAL_COMMUNITY): Payer: Self-pay | Admitting: Family Medicine

## 2021-07-13 DIAGNOSIS — Z3A41 41 weeks gestation of pregnancy: Secondary | ICD-10-CM

## 2021-07-13 DIAGNOSIS — O480 Post-term pregnancy: Secondary | ICD-10-CM

## 2021-07-13 LAB — BIRTH TISSUE RECOVERY COLLECTION (PLACENTA DONATION)

## 2021-07-13 MED ORDER — ZOLPIDEM TARTRATE 5 MG PO TABS: 5.0000 mg | ORAL_TABLET | Freq: Every evening | ORAL | Status: DC | PRN

## 2021-07-13 MED ORDER — TRANEXAMIC ACID-NACL 1000-0.7 MG/100ML-% IV SOLN: 1000.0000 mg | INTRAVENOUS | Status: DC

## 2021-07-13 MED ORDER — MISOPROSTOL 200 MCG PO TABS
400.0000 ug | ORAL_TABLET | Freq: Once | ORAL | Status: AC
  Administered 2021-07-13: 400 ug via RECTAL

## 2021-07-13 MED ORDER — TETANUS-DIPHTH-ACELL PERTUSSIS 5-2.5-18.5 LF-MCG/0.5 IM SUSY: 0.5000 mL | PREFILLED_SYRINGE | Freq: Once | INTRAMUSCULAR | Status: DC

## 2021-07-13 MED ORDER — ONDANSETRON HCL 4 MG PO TABS: 4.0000 mg | ORAL_TABLET | ORAL | Status: DC | PRN

## 2021-07-13 MED ORDER — WITCH HAZEL-GLYCERIN EX PADS: 1.0000 "application " | MEDICATED_PAD | CUTANEOUS | Status: DC | PRN

## 2021-07-13 MED ORDER — BENZOCAINE-MENTHOL 20-0.5 % EX AERO
1.0000 "application " | INHALATION_SPRAY | CUTANEOUS | Status: DC | PRN
  Administered 2021-07-13: 1 via TOPICAL
  Filled 2021-07-13: qty 56

## 2021-07-13 MED ORDER — PRENATAL MULTIVITAMIN CH
1.0000 | ORAL_TABLET | Freq: Every day | ORAL | Status: DC
  Administered 2021-07-13 – 2021-07-14 (×2): 1 via ORAL
  Filled 2021-07-13 (×2): qty 1

## 2021-07-13 MED ORDER — DIBUCAINE (PERIANAL) 1 % EX OINT: 1.0000 "application " | TOPICAL_OINTMENT | CUTANEOUS | Status: DC | PRN

## 2021-07-13 MED ORDER — SIMETHICONE 80 MG PO CHEW
80.0000 mg | CHEWABLE_TABLET | ORAL | Status: DC | PRN
  Administered 2021-07-14: 80 mg via ORAL
  Filled 2021-07-13: qty 1

## 2021-07-13 MED ORDER — TRANEXAMIC ACID-NACL 1000-0.7 MG/100ML-% IV SOLN
INTRAVENOUS | Status: AC
Start: 2021-07-13 — End: 2021-07-13
  Administered 2021-07-13: 1000 mg
  Filled 2021-07-13: qty 100

## 2021-07-13 MED ORDER — DIPHENHYDRAMINE HCL 25 MG PO CAPS: 25.0000 mg | ORAL_CAPSULE | Freq: Four times a day (QID) | ORAL | Status: DC | PRN

## 2021-07-13 MED ORDER — OXYCODONE HCL 5 MG PO TABS: 5.0000 mg | ORAL_TABLET | ORAL | Status: DC | PRN

## 2021-07-13 MED ORDER — SENNOSIDES-DOCUSATE SODIUM 8.6-50 MG PO TABS
2.0000 | ORAL_TABLET | ORAL | Status: DC
  Administered 2021-07-13: 2 via ORAL
  Filled 2021-07-13: qty 2

## 2021-07-13 MED ORDER — MEASLES, MUMPS & RUBELLA VAC IJ SOLR: 0.5000 mL | Freq: Once | INTRAMUSCULAR | Status: DC

## 2021-07-13 MED ORDER — IBUPROFEN 600 MG PO TABS
600.0000 mg | ORAL_TABLET | Freq: Four times a day (QID) | ORAL | Status: DC
Start: 2021-07-13 — End: 2021-07-14
  Administered 2021-07-13 – 2021-07-14 (×6): 600 mg via ORAL
  Filled 2021-07-13 (×6): qty 1

## 2021-07-13 MED ORDER — MISOPROSTOL 200 MCG PO TABS
ORAL_TABLET | ORAL | Status: AC
  Filled 2021-07-13: qty 4

## 2021-07-13 MED ORDER — ONDANSETRON HCL 4 MG/2ML IJ SOLN: 4.0000 mg | INTRAMUSCULAR | Status: DC | PRN

## 2021-07-13 MED ORDER — MISOPROSTOL 200 MCG PO TABS
400.0000 ug | ORAL_TABLET | Freq: Once | ORAL | Status: AC
  Administered 2021-07-13: 400 ug via ORAL

## 2021-07-13 MED ORDER — COCONUT OIL OIL: 1.0000 "application " | TOPICAL_OIL | Status: DC | PRN

## 2021-07-13 MED ORDER — ACETAMINOPHEN 325 MG PO TABS: 650.0000 mg | ORAL_TABLET | ORAL | Status: DC | PRN

## 2021-07-13 NOTE — Lactation Note (Signed)
This note was copied from a baby's chart. ?Lactation Consultation Note ? ?Patient Name: Michelle Turner ?Today's Date: 07/13/2021 ?Reason for consult: Initial assessment;Mother's request;Primapara;1st time breastfeeding;Term ?Age:26 hours ? ?LC assisted with latching infant at the breast with signs of milk transfer. Infant still feeding at the end of the visit.  ? ?Plan 1. To feed based on cues 8-12x 24hr period. Mom to offer breasts and look for signs of milk transfer.  ?2. If unable to latch, Mom to offer EBM on spoon 5-57ml ?3. I and O sheet reviewed.  ?All questions answered at the end of the visit.  ? ?Maternal Data ?Has patient been taught Hand Expression?: Yes ?Does the patient have breastfeeding experience prior to this delivery?: No ? ?Feeding ?Mother's Current Feeding Choice: Breast Milk ? ?LATCH Score ?Latch: Repeated attempts needed to sustain latch, nipple held in mouth throughout feeding, stimulation needed to elicit sucking reflex. ? ?Audible Swallowing: Spontaneous and intermittent ? ?Type of Nipple: Everted at rest and after stimulation ? ?Comfort (Breast/Nipple): Filling, red/small blisters or bruises, mild/mod discomfort ? ?Hold (Positioning): Assistance needed to correctly position infant at breast and maintain latch. ? ?LATCH Score: 7 ? ? ?Lactation Tools Discussed/Used ?  ? ?Interventions ?Interventions: Breast feeding basics reviewed;Assisted with latch;Skin to skin;Breast massage;Hand express;Breast compression;Adjust position;Support pillows;Position options;Expressed milk;Education;LC Magazine features editor;Infant Driven Feeding Algorithm education ? ?Discharge ?[place] Program: Yes ? ?Consult Status ?Consult Status: Follow-up ?Date: 07/14/21 ? ? ? ?Evaline Waltman  Nicholson-Springer ?07/13/2021, 12:55 PM ? ? ? ?

## 2021-07-13 NOTE — Progress Notes (Signed)
Michelle Turner is a 26 y.o. G1P0 at [redacted]w[redacted]d admitted for  [place] ? ?Subjective: ?Nurse notified Midwife, patient is breathing well through contractions. Foley balloon movable when RN applied gentle traction, SROM along with foley balloon removal. Patient declines sterile vag exam.    ? ?Objective: ?BP 111/63   Pulse (!) 58   Temp 98.2 ?F (36.8 ?C) (Oral)   Resp 18   Ht 5\' 4"  (1.626 m)   Wt 67.1 kg   LMP 09/28/2020   BMI 25.40 kg/m?  ?No intake/output data recorded. ? ?FHR baseline 130 bpm, Variability: moderate, Accelerations:present, Decelerations:  Early ?Toco: regular, every 1-2 minutes  ? ?SVE:   Dilation: 1 ?Effacement (%): 30 ?Station: -2 ?Exam by:: Derrill Memo, CNM ? ? ? ?Labs: ?Lab Results  ?Component Value Date  ? WBC 9.8 07/12/2021  ? HGB 13.4 07/12/2021  ? HCT 37.2 07/12/2021  ? MCV 84.0 07/12/2021  ? PLT 247 07/12/2021  ? ? ?Assessment / Plan: ?Expectant management ?Free movement ?Sterile vag exam at patient's consent ? ?Labor: early ?Fetal Wellbeing:  Category I ?Pain Control:  labor support without medications ?Pre-eclampsia: N/A ?I/D:  GBS neg ?Anticipated MOD: hopeful for vaginal birth ? ?[place], SNM ?07/13/2021, 1:04 AM  ?

## 2021-07-13 NOTE — Lactation Note (Signed)
This note was copied from a baby's chart. ?Lactation Consultation Note ? ?Patient Name: Michelle Turner ?Today's Date: 07/13/2021 ? ?Dad was taking baby from mom when [facility] entered the room.  Baby just fed and mom was feeling pain and like she was bleeding more heavily and cramping.  LC called RN for mom.  LC talked to mom about oxytocin when feeding, cramping, and emptying her bladder if needed prior to feeding. ? ?Dad will call LC back when mom is ready for initial lactation visit.   ?  ?Age:26 hours ? ?Maternal Data ?  ? ?Feeding ?  ? ?LATCH Score ?Latch: Repeated attempts needed to sustain latch, nipple held in mouth throughout feeding, stimulation needed to elicit sucking reflex. ? ?Audible Swallowing: Spontaneous and intermittent ? ?Type of Nipple: Everted at rest and after stimulation ? ?Comfort (Breast/Nipple): Soft / non-tender ? ?Hold (Positioning): Assistance needed to correctly position infant at breast and maintain latch. ? ?LATCH Score: 8 ? ? ?Lactation Tools Discussed/Used ?  ? ?Interventions ?  ? ?Discharge ?  ? ?Consult Status ?  ? ? ? ?Maryruth Hancock Jamauri Kruzel ?07/13/2021, 9:58 AM ? ? ? ?

## 2021-07-13 NOTE — Discharge Summary (Signed)
? ?  Postpartum Discharge Summary ? ? ?   ?Patient Name: Michelle Turner ?DOB: 1996-02-26 ?MRN: [id number] ? ?Date of admission: 07/12/2021 ?Delivery date:07/13/2021  ?Delivering provider: Serita Grammes D  ?Date of discharge: 07/14/2021 ? ?Admitting diagnosis: Indication for care in labor or delivery [O75.9] ?Intrauterine pregnancy: [redacted]w[redacted]d    ?Secondary diagnosis:  Principal Problem: ?  SVD (spontaneous vaginal delivery) ?Active Problems: ?  Rh negative state in antepartum period ?  Indication for care in labor or delivery ? ?Additional problems: none    ?Discharge diagnosis: Term Pregnancy Delivered                                              ?Post partum procedures: none ?Augmentation: Cytotec and IP Foley ?Complications: None ? ?Hospital course: Induction of Labor With Vaginal Delivery   ?26y.o. yo G1P0 at 424w1das admitted to the hospital 07/12/2021 for induction of labor.  Indication for induction: Postdates.  Patient had an uncomplicated labor course as follows: ?Membrane Rupture Time/Date: 12:34 AM ,07/13/2021   ?Delivery Method:Vaginal, Spontaneous  ?Episiotomy: None  ?Lacerations:  2nd degree;Perineal  ?Details of delivery can be found in separate delivery note.  Patient had a routine postpartum course. Patient is discharged home 07/14/21. ? ?Newborn Data: ?Birth date:07/13/2021  ?Birth time:3:49 AM  ?Gender:Female  ?Living status:Living  ?Apgars:9 ,9  ?Weight:3470 g (7lb 10.4oz) ? ?Magnesium Sulfate received: No ?BMZ received: No ?Rhophylac:No (baby Rh neg also) ?MMR:N/A ?T-DaP:Given prenatally ?Flu: Yes ?Transfusion:No ? ?Physical exam  ?Vitals:  ? 07/13/21 1145 07/13/21 1615 07/13/21 1930 07/14/21 0607  ?BP: 101/62 115/69 108/69 116/74  ?Pulse: 71 65 69 84  ?Resp: _0 ?Temp: 99.5 ?F (37.5 ?C) 98.1 ?F (36.7 ?C) 98 ?F (36.7 ?C) 98.7 ?F (37.1 ?C)  ?TempSrc: Oral Oral Oral Oral  ?SpO2: 100% 98% 98% 99%  ?Weight:      ?Height:      ? ?General: alert, cooperative, and no distress ?Lochia:  appropriate ?Uterine Fundus: firm ?Incision: N/A ?DVT Evaluation: No evidence of DVT seen on physical exam. ?Labs: ?Lab Results  ?Component Value Date  ? WBC 9.8 07/12/2021  ? HGB 13.4 07/12/2021  ? HCT 37.2 07/12/2021  ? MCV 84.0 07/12/2021  ? PLT 247 07/12/2021  ? ?   ? View : No data to display.  ?  ?  ?  ? ?Edinburgh Score: ? ?  07/13/2021  ?  7:30 PM  ?Edinburgh Postnatal Depression Scale Screening Tool  ?I have been able to laugh and see the funny side of things. 0  ?I have looked forward with enjoyment to things. 0  ?I have blamed myself unnecessarily when things went wrong. 2  ?I have been anxious or worried for no good reason. 3  ?I have felt scared or panicky for no good reason. 1  ?Things have been getting on top of me. 1  ?I have been so unhappy that I have had difficulty sleeping. 1  ?I have felt sad or miserable. 1  ?I have been so unhappy that I have been crying. 1  ?The thought of harming myself has occurred to me. 0  ?Edinburgh Postnatal Depression Scale Total 10  ? ? ? ?After visit meds:  ?Allergies as of 07/14/2021   ?No Known Allergies ?  ? ?  ?Medication List  ?  ? ?STOP taking these  medications   ? ?acetaminophen 500 MG tablet ?Commonly known as: TYLENOL ?  ? ?  ? ?TAKE these medications   ? ?albuterol 108 (90 Base) MCG/ACT inhaler ?Commonly known as: VENTOLIN HFA ?Inhale 2 puffs into the lungs every 6 (six) hours as needed for wheezing or shortness of breath. ?  ?ibuprofen 600 MG tablet ?Commonly known as: ADVIL ?Take 1 tablet (600 mg total) by mouth every 6 (six) hours. ?  ?LIDOCAINE EX ?Apply 1 application. topically 2 (two) times daily as needed (hemorrhoids). ?  ?multivitamin-prenatal 27-0.8 MG Tabs tablet ?Take 1 tablet by mouth daily at 12 noon. ?  ?[place] ?Apply 1 application. topically 2 (two) times daily as needed (hemorrhoids). ?  ? ?  ? ? ? ?Discharge home in stable condition ?Infant Feeding: Breast ?Infant Disposition:home with mother ?Discharge instruction: per After Visit  Summary and Postpartum booklet. ?Activity: Advance as tolerated. Pelvic rest for 6 weeks.  ?Diet: routine diet ?Future Appointments: ?Future Appointments  ?Date Time Provider [place]  ?08/30/2021  9:15 AM Johnston Ebbs, NP [facility] None  ? ?Follow up Visit: ? Follow-up Information   ? ? [facility]. Schedule an appointment as soon as possible for a visit in 4 week(s).   ?Specialty: Obstetrics and Gynecology ?Why: 08/30/21 at 0915 ?Contact information: ?[address] ?[place] ?[phone] ? ?  ?  ? ?  ?  ? ?  ? ? ?Myrtis Ser, CNM  P [facility] ?Please schedule this patient for Postpartum visit in: 6 weeks with the following provider: Any provider  ?In-Person  ?For C/S patients schedule nurse incision check in weeks 2 weeks: no  ?Low risk pregnancy complicated by: Rh neg  ?Delivery mode:  SVD  ?Anticipated Birth Control:  IUD  ?PP Procedures needed: none  ?Schedule Integrated BH visit: no  ? ?07/14/2021 ?Hansel Feinstein, CNM ? ? ? ?

## 2021-07-13 NOTE — Progress Notes (Signed)
Patient ID: Michelle Turner, female   DOB: 03/20/1996, 26 y.o.   MRN: [id number] ? ?Pushing w ctx; s/p cytotec x 2 doses, cervical foley, then SROM at 0034 ? ?BP 128/70, P 60 ?FHR 120s, +accels, occ variables ?Ctx q 1.5 mins ?Cx C/C/vtx +1 ? ?IUP@41 .1wks ?End 1st stage ? ?Begin pushing w ctx ?Anticipate vag del ? ?Arabella Merles CNM ?07/13/2021 ?3:29 AM ? ?

## 2021-07-14 MED ORDER — IBUPROFEN 600 MG PO TABS: 600.0000 mg | ORAL_TABLET | Freq: Four times a day (QID) | ORAL | 0 refills | Status: AC

## 2021-07-14 NOTE — Lactation Note (Signed)
This note was copied from a baby's chart. ?Lactation Consultation Note ? ?Patient Name: Michelle Turner ?Today's Date: 07/14/2021 ?Reason for consult: Follow-up assessment;Primapara;1st time breastfeeding;Term ?Age:26 hours ? ?Lactation followed up with Ms. Kerper. I observed her latch baby Elowyn to the right breast in cradle hold. Rhythmic suckling sequences were observed. She states that baby is latching 'really well.' ? ?I conducted discharge education. I recommended that she breast feed baby on demand 8-12 times a day, waking to feed as needed. I recommended that she offer both breasts in a feeding. ? ?Ms. Schnackenberg is following up with [facility]. ? ?I reviewed our community breast feeding resources and recommended that she call for assistance as needed. I also encouraged her to attend support group. ? ?Maternal Data ?Has patient been taught Hand Expression?: Yes ?Does the patient have breastfeeding experience prior to this delivery?: No ? ?Feeding ?Mother's Current Feeding Choice: Breast Milk ? ?LATCH Score ?Latch: Grasps breast easily, tongue down, lips flanged, rhythmical sucking. ? ?Audible Swallowing: A few with stimulation ? ?Type of Nipple: Everted at rest and after stimulation ? ?Comfort (Breast/Nipple): Soft / non-tender ? ?Hold (Positioning): No assistance needed to correctly position infant at breast. ? ?LATCH Score: 9 ? ?  ? ?Interventions ?Interventions: Breast feeding basics reviewed;Education ? ?Discharge ?Discharge Education: Engorgement and breast care;Warning signs for feeding baby;Outpatient recommendation ? ?Consult Status ?Consult Status: Complete ?Date: 07/14/21 ? ? ? ?Walker Shadow ?07/14/2021, 1:06 PM ? ? ? ?

## 2021-07-14 NOTE — Social Work (Addendum)
CSW received consult for hx of Anxiety and Edinburgh 10. CSW met with MOB to offer support and complete assessment.   ? ?CSW met with MOB at bedside and introduced CSW role. CSW observed MOB and FOB up in the room, the infant asleep in the bassinet. MOB presented calm and receptive to [place] visit. CSW offered MOB privacy. MOB gave CSW permission to share information in front of FOB. CSW inquired how MOB has felt since giving birth. MOB reported "feeling fine getting better every hour." MOB shared the delivery was ?good and quick.? CSW inquired how MOB felt emotionally during the pregnancy. MOB reported feeling "up and down.? MOB explained that she felt anxious about the infant's future and her new life as a mom. CSW validated MOB worries. CSW discussed MOB [place]. MOB shared that she was stressed about the infant's delivery date since she was past the predicted due date. MOB reported that she felt anxious and blamed herself for because there was nothing, she could do to change the process. MOB reported that she was diagnosed with anxiety in 2019 and prescribed Zoloft for her symptoms. MOB reported that she stopped taking the Zoloft because she felt the dose was too high. MOB reported she self manages by practicing yoga and meditation. MOB reported she also changed her diet to gluten free which she feels has helped her mental health. MOB identified FOB, family and friends as supports.  ? ?CSW discussed PPD. CSW provided education regarding the baby blues period vs. perinatal mood disorders, discussed treatment and gave resources for mental health follow up if concerns arise.  CSW recommended MOB complete a self-evaluation during the postpartum time period using the New Mom Checklist from Postpartum Progress and encouraged MOB to contact a medical professional if symptoms are noted at any time. CSW assessed MOB for safety. MOB reported no thoughts of harm to self and others.  ? ?CSW provided review of Sudden Infant  Death Syndrome (SIDS) precautions. MOB reported she has essential items for the infant including a bassinet where the infant will sleep. MOB has chosen [facility] for the infant's follow up appointment. CSW assessed MOB for additional needs. MOB reported no further need.  ? ?CSW identifies no further need for intervention and no barriers to discharge at this time. ? ?Kathrin Greathouse, MSW, LCSW ?[facility] [place]  ?Clinical Social Worker  ?[phone] ?07/14/2021  11:50 AM  ?

## 2021-07-14 NOTE — Lactation Note (Deleted)
This note was copied from a baby's chart. ?Lactation Consultation Note ? ?Patient Name: Michelle Turner ?Today's Date: 07/14/2021 ?Reason for consult: Follow-up assessment;Term ?Age:26 hours ? ?Lactation followed up with Ms. Moch. She was having breast fast with her support person upon entry and baby [name] was sleeping. She reports that feedings are going well; baby cluster fed overnight. She offered [name] some of her EBM by bottle this am due to concern about 7% weight loss. However, her pediatrician reassured her this morning that it was normal; baby has had ample voids and stools, is eating frequently, and Ms. Wiesner has been able to express her colostrum to offer back to baby after breast feeding, as needed. ? ?She reports that she will follow up with [facility], and she is aware that there are lactation services offered there. I reviewed our community breast feeding resources and discharge education. She declined help today, but I encouraged her to call for assistance as needed. ? ?I encouraged her to breast feed baby on demand 8-12 times a day, and to offer both breasts in a feeding. ? ?Maternal Data ?Has patient been taught Hand Expression?: Yes ?Does the patient have breastfeeding experience prior to this delivery?: Yes ? ?Feeding ?Mother's Current Feeding Choice: Breast Milk ? ? ?Discharge ?Discharge Education: Engorgement and breast care;Other (comment) (Community breast feeding resourses) ?Pump: Personal ? ?Consult Status ?Consult Status: Complete ?Date: 07/14/21 ?Follow-up type: Call as needed ? ? ? ?Lenore Manner ?07/14/2021, 9:00 AM ? ? ? ?

## 2021-07-22 ENCOUNTER — Telehealth (HOSPITAL_COMMUNITY): Payer: Self-pay

## 2021-07-22 DIAGNOSIS — Z1331 Encounter for screening for depression: Secondary | ICD-10-CM

## 2021-07-22 NOTE — Telephone Encounter (Addendum)
No answer. Left message to return nurse call. ? ?EPDS score on 07/13/21 was 10. Referral placed for integrated behavioral health.  ? Heron Nay ?07/22/2021,1135 ?

## 2021-08-30 ENCOUNTER — Ambulatory Visit (INDEPENDENT_AMBULATORY_CARE_PROVIDER_SITE_OTHER): Payer: BLUE CROSS/BLUE SHIELD | Admitting: Obstetrics

## 2021-08-30 ENCOUNTER — Encounter: Payer: Self-pay | Admitting: Obstetrics

## 2021-08-30 DIAGNOSIS — G43109 Migraine with aura, not intractable, without status migrainosus: Secondary | ICD-10-CM | POA: Diagnosis not present

## 2021-08-30 DIAGNOSIS — Z3009 Encounter for other general counseling and advice on contraception: Secondary | ICD-10-CM

## 2021-08-30 NOTE — Progress Notes (Signed)
..   Post Partum Visit Note  Michelle Turner is a 26 y.o. G70P1001 female who presents for a postpartum visit. She is 7 weeks postpartum following a normal spontaneous vaginal delivery.  I have fully reviewed the prenatal and intrapartum course. The delivery was at 41.1 gestational weeks.  Anesthesia: IV sedation. Postpartum course has been good. Baby is doing well. Baby is feeding by breast. Bleeding no bleeding. Bowel function is normal. Bladder function is normal. Patient is not sexually active. Contraception method is none. Postpartum depression screening: negative.   The pregnancy intention screening data noted above was reviewed. Potential methods of contraception were discussed. The patient elected to proceed with No data recorded.   Edinburgh Postnatal Depression Scale - 08/30/21 1520       Edinburgh Postnatal Depression Scale:  In the Past 7 Days   I have been able to laugh and see the funny side of things. 0    I have looked forward with enjoyment to things. 0    I have blamed myself unnecessarily when things went wrong. 0    I have been anxious or worried for no good reason. 2    I have felt scared or panicky for no good reason. 0    Things have been getting on top of me. 2    I have been so unhappy that I have had difficulty sleeping. 0    I have felt sad or miserable. 1    I have been so unhappy that I have been crying. 0    The thought of harming myself has occurred to me. 0    Edinburgh Postnatal Depression Scale Total 5             Health Maintenance Due  Topic Date Due   COVID-19 Vaccine (1) Never done   HPV VACCINES (1 - 2-dose series) Never done   PAP-Cervical Cytology Screening  Never done   PAP SMEAR-Modifier  Never done    The following portions of the patient's history were reviewed and updated as appropriate: allergies, current medications, past family history, past medical history, past social history, past surgical history, and problem list.  Review of  Systems A comprehensive review of systems was negative.  Objective:  BP 93/65   Pulse 98   Ht 5\' 4"  (1.626 m)   Wt 129 lb 4.8 oz (58.7 kg)   LMP 09/28/2020   Breastfeeding Yes   BMI 22.19 kg/m    General:  alert and no distress   Breasts:  normal  Lungs: clear to auscultation bilaterally  Heart:  regular rate and rhythm, S1, S2 normal, no murmur, click, rub or gallop  Abdomen: soft, non-tender; bowel sounds normal; no masses,  no organomegaly   Wound none  GU exam:  not indicated       Assessment:    1. Postpartum care following vaginal delivery  2. Migraine with aura and without status migrainosus, not intractable  3. Encounter for other general counseling and advice on contraception - options discussed.  Hormonal contraception with estrogen not recommended because of migraines wit aura.  She is considering tubal sterilization vs ParaGuard IUD   Plan:   Essential components of care per ACOG recommendations:  1.  Mood and well being: Patient with negative depression screening today. Reviewed local resources for support.  - Patient tobacco use? No.   - hx of drug use? No.    2. Infant care and feeding:  -Patient currently breastmilk feeding? Yes. Discussed returning  to work and pumping. Reviewed importance of draining breast regularly to support lactation.  -Social determinants of health (SDOH) reviewed in EPIC. No concerns  3. Sexuality, contraception and birth spacing - Patient does not want a pregnancy in the next year.  Desired family size is 1 children.  - Reviewed reproductive life planning. Reviewed contraceptive methods based on pt preferences and effectiveness.  Patient desired No Method - No Contraceptive Precautions today.   - Discussed birth spacing of 18 months  4. Sleep and fatigue -Encouraged family/partner/community support of 4 hrs of uninterrupted sleep to help with mood and fatigue  5. Physical Recovery  - Discussed patients delivery and  complications. She describes her labor as good. - Patient had a Vaginal, no problems at delivery. Patient had a 2nd degree laceration. Perineal healing reviewed. Patient expressed understanding - Patient has urinary incontinence? No. - Patient is safe to resume physical and sexual activity  6.  Health Maintenance - HM due items addressed Yes - Last pap smear No results found for: DIAGPAP Pap smear not done at today's visit.  -Breast Cancer screening indicated? No.   7. Chronic Disease/Pregnancy Condition follow up: None  - PCP follow up  Coral Ceo, MD [facility] for [facility], [facility], [facility] 08/30/21

## 2021-09-27 ENCOUNTER — Ambulatory Visit: Payer: BLUE CROSS/BLUE SHIELD | Admitting: Obstetrics and Gynecology

## 2021-12-15 ENCOUNTER — Ambulatory Visit (HOSPITAL_COMMUNITY)
Admission: EM | Admit: 2021-12-15 | Discharge: 2021-12-15 | Disposition: A | Discharge status: Home / Self Care | Payer: BLUE CROSS/BLUE SHIELD | Attending: Physician Assistant | Admitting: Physician Assistant

## 2021-12-15 ENCOUNTER — Ambulatory Visit (INDEPENDENT_AMBULATORY_CARE_PROVIDER_SITE_OTHER): Payer: BLUE CROSS/BLUE SHIELD

## 2021-12-15 ENCOUNTER — Encounter (HOSPITAL_COMMUNITY): Payer: Self-pay

## 2021-12-15 DIAGNOSIS — M546 Pain in thoracic spine: Secondary | ICD-10-CM | POA: Diagnosis not present

## 2021-12-15 MED ORDER — LIDOCAINE 5 % EX PTCH
1.0000 | MEDICATED_PATCH | CUTANEOUS | 0 refills | Status: AC
Start: 2021-12-15 — End: ?

## 2021-12-15 NOTE — ED Provider Notes (Signed)
[facility]    CSN: [id number] Arrival date & time: 12/15/21  1223      History   Chief Complaint Chief Complaint  Patient presents with   Back Pain    HPI Michelle Turner is a 26 y.o. female.   Patient presents today with a several day history of worsening thoracic back pain.  Reports that she has had some discomfort in this area since having her daughter 5 months ago, however, a few days ago when she was picking up her daughter she felt a sudden pain in her back that has been persistent.  Pain is rated 10 on a 0-10 pain scale, described as intense aching, no aggravating or alleviating factors identified.  She has been taking ibuprofen as well as using a topical medication with temporary/minimal improvement of symptoms.  She is currently breast-feeding.  She denies any bowel/bladder incontinence, lower extremity weakness, saddle anesthesia.  Does report having epidural blood patch when she was much younger but denies additional spinal surgeries or procedures.    Past Medical History:  Diagnosis Date   Anorexia    Anxiety    Asthma    Migraines     Patient Active Problem List   Diagnosis Date Noted   SVD (spontaneous vaginal delivery) 07/13/2021   Indication for care in labor or delivery 07/12/2021   Supervision of normal first pregnancy, antepartum 02/23/2021   Rh negative state in antepartum period 02/23/2021    Past Surgical History:  Procedure Laterality Date   LUMBAR PUNCTURE      OB History     Gravida  1   Para  1   Term  1   Preterm      AB      Living  1      SAB      IAB      Ectopic      Multiple  0   Live Births  1            Home Medications    Prior to Admission medications   Medication Sig Start Date End Date Taking? Authorizing Provider  lidocaine (LIDODERM) 5 % Place 1 patch onto the skin daily. Remove & Discard patch within 12 hours or as directed by MD.  Use only 1 patch per 24 hours. 12/15/21  Yes Harjot Zavadil  K, PA-C  albuterol (VENTOLIN HFA) 108 (90 Base) MCG/ACT inhaler Inhale 2 puffs into the lungs every 6 (six) hours as needed for wheezing or shortness of breath. Patient not taking: Reported on 08/30/2021 05/04/21   Gerrit Heck, CNM  ibuprofen (ADVIL) 600 MG tablet Take 1 tablet (600 mg total) by mouth every 6 (six) hours. Patient not taking: Reported on 08/30/2021 07/14/21   Aviva Signs, CNM    Family History Family History  Problem Relation Age of Onset   Hypertension Mother     Social History Social History   Tobacco Use   Smoking status: Never   Smokeless tobacco: Never  Vaping Use   Vaping Use: Former  Substance Use Topics   Alcohol use: Not Currently   Drug use: Not Currently    Types: Marijuana     Allergies   Patient has no known allergies.   Review of Systems Review of Systems  Constitutional:  Positive for activity change. Negative for appetite change, fatigue and fever.  Gastrointestinal:  Negative for abdominal pain, diarrhea, nausea and vomiting.  Genitourinary:  Negative for dysuria, frequency and urgency.  Musculoskeletal:  Positive for back pain. Negative for arthralgias and myalgias.  Neurological:  Negative for weakness and numbness.     Physical Exam Triage Vital Signs ED Triage Vitals  Enc Vitals Group     BP 12/15/21 1330 93/63     Pulse Rate 12/15/21 1330 72     Resp 12/15/21 1330 12     Temp 12/15/21 1330 98.3 F (36.8 C)     Temp Source 12/15/21 1330 Oral     SpO2 12/15/21 1330 96 %     Weight 12/15/21 1326 118 lb (53.5 kg)     Height 12/15/21 1326 5\' 4"  (1.626 m)     Head Circumference --      Peak Flow --      Pain Score 12/15/21 1325 10     Pain Loc --      Pain Edu? --      Excl. in [place]? --    No data found.  Updated Vital Signs BP 93/63 (BP Location: Left Arm)   Pulse 72   Temp 98.3 F (36.8 C) (Oral)   Resp 12   Ht 5\' 4"  (1.626 m)   Wt 118 lb (53.5 kg)   LMP  (LMP Unknown) Comment: pt has not have a period since  given birth 5 months ago  SpO2 96%   Breastfeeding Yes   BMI 20.25 kg/m   Visual Acuity Right Eye Distance:   Left Eye Distance:   Bilateral Distance:    Right Eye Near:   Left Eye Near:    Bilateral Near:     Physical Exam Vitals reviewed.  Constitutional:      General: She is awake. She is not in acute distress.    Appearance: Normal appearance. She is well-developed. She is not ill-appearing.     Comments: Very pleasant female appears stated age in no acute distress sitting comfortably in exam room  HENT:     Head: Normocephalic and atraumatic.  Cardiovascular:     Rate and Rhythm: Normal rate and regular rhythm.     Heart sounds: Normal heart sounds, S1 normal and S2 normal. No murmur heard. Pulmonary:     Effort: Pulmonary effort is normal.     Breath sounds: Normal breath sounds. No wheezing, rhonchi or rales.     Comments: Clear to auscultation bilaterally Abdominal:     Palpations: Abdomen is soft.     Tenderness: There is no abdominal tenderness.  Musculoskeletal:     Cervical back: No tenderness or bony tenderness.     Thoracic back: Tenderness present. No bony tenderness.     Lumbar back: No tenderness or bony tenderness.     Comments: Mild tenderness palpation over thoracic paraspinal muscles but patient reports pain is deeper.  Denies any pain percussion of vertebrae.  No deformity or step-off noted.  Psychiatric:        Behavior: Behavior is cooperative.      UC Treatments / Results  Labs (all labs ordered are listed, but only abnormal results are displayed) Labs Reviewed - No data to display  EKG   Radiology DG Thoracic Spine 2 View  Result Date: 12/15/2021 CLINICAL DATA:  Pain and decreased range of motion. Patient reports progressive upper back pain. EXAM: THORACIC SPINE 2 VIEWS COMPARISON:  None Available. FINDINGS: The alignment is maintained. Normal thoracic kyphosis. Vertebral body heights are maintained. No significant disc space narrowing.  No fracture or evidence of focal bone abnormality. Posterior elements appear intact. There is no paravertebral  soft tissue abnormality. IMPRESSION: Negative radiographs of the thoracic spine. Electronically Signed   By: Narda Rutherford M.D.   On: 12/15/2021 15:03    Procedures Procedures (including critical care time)  Medications Ordered in UC Medications - No data to display  Initial Impression / Assessment and Plan / UC Course  I have reviewed the triage vital signs and the nursing notes.  Pertinent labs & imaging results that were available during my care of the patient were reviewed by me and considered in my medical decision making (see chart for details).     Plain films were initially deferred as patient has no bony tenderness and denies any recent trauma, however, given severity of her pain and significant decrease in her activities related to pain plain films were obtained.  These were normal with no evidence of acute osseous abnormality.  She is breast-feeding and so we are limited in the medications that can be used.  She was encouraged to continue over-the-counter medications including Tylenol and ibuprofen.  She was prescribed lidocaine patches for additional symptom relief.  Recommended that she use heat and massage for additional symptom relief.  Offered prescription for muscle relaxer, however, she would have to pump and dump which is not something she is able to do so this medication was deferred.  Discussed that if she has any worsening symptoms or if anything changes she should return for reevaluation.  Final Clinical Impressions(s) / UC Diagnoses   Final diagnoses:  Acute bilateral thoracic back pain     Discharge Instructions      Your x-ray was normal.  Continue ibuprofen and Tylenol as we discussed.  Use lidocaine patches for additional symptom relief.  Use heat and gentle stretch as well as massage for symptom relief.  If symptoms or not improving please follow-up  with sports medicine; call to schedule an appointment.  If you have any worsening symptoms including weakness, numbness, tingling sensation, going to the bathroom on yourself without noticing it, trouble walking, worsening pain, fever you need to be seen immediately.     ED Prescriptions     Medication Sig Dispense Auth. Provider   lidocaine (LIDODERM) 5 % Place 1 patch onto the skin daily. Remove & Discard patch within 12 hours or as directed by MD.  Use only 1 patch per 24 hours. 30 patch Jelissa Espiritu, Noberto Retort, PA-C      PDMP not reviewed this encounter.   Jeani Hawking, PA-C 12/15/21 1519

## 2021-12-15 NOTE — ED Triage Notes (Signed)
Pt is here for upper back pain for a month but gradual]  gotten worse over the last two days , causing some discomfort when walking. Pt states back feels very tight.

## 2021-12-15 NOTE — Discharge Instructions (Signed)
Your x-ray was normal.  Continue ibuprofen and Tylenol as we discussed.  Use lidocaine patches for additional symptom relief.  Use heat and gentle stretch as well as massage for symptom relief.  If symptoms or not improving please follow-up with sports medicine; call to schedule an appointment.  If you have any worsening symptoms including weakness, numbness, tingling sensation, going to the bathroom on yourself without noticing it, trouble walking, worsening pain, fever you need to be seen immediately.

## 2022-03-04 ENCOUNTER — Ambulatory Visit (INDEPENDENT_AMBULATORY_CARE_PROVIDER_SITE_OTHER): Payer: BLUE CROSS/BLUE SHIELD

## 2022-03-04 ENCOUNTER — Ambulatory Visit (HOSPITAL_COMMUNITY)
Admission: EM | Admit: 2022-03-04 | Discharge: 2022-03-04 | Disposition: A | Discharge status: Home / Self Care | Payer: BLUE CROSS/BLUE SHIELD | Attending: Family Medicine | Admitting: Family Medicine

## 2022-03-04 ENCOUNTER — Encounter (HOSPITAL_COMMUNITY): Payer: Self-pay

## 2022-03-04 DIAGNOSIS — R0781 Pleurodynia: Secondary | ICD-10-CM | POA: Diagnosis not present

## 2022-03-04 DIAGNOSIS — R0782 Intercostal pain: Secondary | ICD-10-CM | POA: Diagnosis not present

## 2022-03-04 DIAGNOSIS — J069 Acute upper respiratory infection, unspecified: Secondary | ICD-10-CM | POA: Diagnosis not present

## 2022-03-04 MED ORDER — CYCLOBENZAPRINE HCL 5 MG PO TABS: 5.0000 mg | ORAL_TABLET | Freq: Two times a day (BID) | ORAL | 0 refills | Status: AC | PRN

## 2022-03-04 NOTE — ED Triage Notes (Signed)
Chief Complaint: productive cough with yellow mucus and right side rib pain. No fever, runny nose, chills, or body aches. No SOB or wheezing.   Onset: 2 weeks ago, started off with sinus symptoms, went into flu like symptoms, now only the cough remains.   Prescriptions or OTC medications tried: Yes- ibuprofen, Robitussin    with little relief  Sick exposure: Yes- her boyfriend but no diagnosis   New foods, medications, or products: No  Recent Travel: No

## 2022-03-04 NOTE — Discharge Instructions (Signed)
You were seen today for rib pain.  Your xray was negative for fracture.  Your lungs appear normal.  This is likely muscular in nature.  I have sent out a muscle relaxer.  This may make you tired/sleepy so take when home and not driving.  I recommend motrin for pain. You may also use heat/ice.

## 2022-03-04 NOTE — ED Provider Notes (Addendum)
[facility]    CSN: [id number] Arrival date & time: 03/04/22  1523      History   Chief Complaint Chief Complaint  Patient presents with   Cough    HPI Michelle Turner is a 26 y.o. female.   Patient is here for URI symptoms.  Yesterday morning she noted pain in her "lung" area.  She was having pain in the rib area with coughing or if her daughter lays on her.  About 2 weeks ago she started with sinus congestion, drainage, and then developed a cough.  Those symptoms are improving overall.  No fevers/chills.  Hurts to take a deep breath.  She does have asthma so maybe a little wheezing.  Taking motrin and robitussin.           Past Medical History:  Diagnosis Date   Anorexia    Anxiety    Asthma    Migraines     Patient Active Problem List   Diagnosis Date Noted   SVD (spontaneous vaginal delivery) 07/13/2021   Indication for care in labor or delivery 07/12/2021   Supervision of normal first pregnancy, antepartum 02/23/2021   Rh negative state in antepartum period 02/23/2021    Past Surgical History:  Procedure Laterality Date   LUMBAR PUNCTURE      OB History     Gravida  1   Para  1   Term  1   Preterm      AB      Living  1      SAB      IAB      Ectopic      Multiple  0   Live Births  1            Home Medications    Prior to Admission medications   Medication Sig Start Date End Date Taking? Authorizing Provider  albuterol (VENTOLIN HFA) 108 (90 Base) MCG/ACT inhaler Inhale 2 puffs into the lungs every 6 (six) hours as needed for wheezing or shortness of breath. Patient not taking: Reported on 08/30/2021 05/04/21   Gerrit Heck, CNM  ibuprofen (ADVIL) 600 MG tablet Take 1 tablet (600 mg total) by mouth every 6 (six) hours. Patient not taking: Reported on 08/30/2021 07/14/21   Aviva Signs, CNM  lidocaine (LIDODERM) 5 % Place 1 patch onto the skin daily. Remove & Discard patch within 12 hours or as directed by MD.   Use only 1 patch per 24 hours. 12/15/21   Raspet, Noberto Retort, PA-C    Family History Family History  Problem Relation Age of Onset   Hypertension Mother     Social History Social History   Tobacco Use   Smoking status: Never   Smokeless tobacco: Never  Vaping Use   Vaping Use: Former  Substance Use Topics   Alcohol use: Not Currently   Drug use: Not Currently    Types: Marijuana     Allergies   Patient has no known allergies.   Review of Systems Review of Systems  Constitutional: Negative.   HENT:  Positive for congestion and rhinorrhea.   Respiratory:  Positive for cough.   Cardiovascular: Negative.   Gastrointestinal: Negative.   Genitourinary: Negative.   Hematological: Negative.   Psychiatric/Behavioral: Negative.       Physical Exam Triage Vital Signs ED Triage Vitals  Enc Vitals Group     BP 03/04/22 1620 102/72     Pulse Rate 03/04/22 1620 90  Resp 03/04/22 1620 16     Temp 03/04/22 1620 99 F (37.2 C)     Temp Source 03/04/22 1620 Oral     SpO2 03/04/22 1620 98 %     Weight --      Height --      Head Circumference --      Peak Flow --      Pain Score 03/04/22 1618 4     Pain Loc --      Pain Edu? --      Excl. in [place]? --    No data found.  Updated Vital Signs BP 102/72 (BP Location: Left Arm)   Pulse 90   Temp 99 F (37.2 C) (Oral)   Resp 16   LMP 02/27/2022 (Approximate)   SpO2 98%   Breastfeeding No   Visual Acuity Right Eye Distance:   Left Eye Distance:   Bilateral Distance:    Right Eye Near:   Left Eye Near:    Bilateral Near:     Physical Exam Constitutional:      Appearance: Normal appearance.  HENT:     Mouth/Throat:     Mouth: Mucous membranes are moist.  Cardiovascular:     Rate and Rhythm: Normal rate and regular rhythm.  Pulmonary:     Effort: Pulmonary effort is normal.     Breath sounds: Normal breath sounds. No wheezing or rhonchi.     Comments: Point tenderness to the right anterior chest wall, just  under the right breast Chest:     Chest wall: Tenderness present.  Musculoskeletal:     Cervical back: Normal range of motion and neck supple.  Skin:    General: Skin is warm.  Neurological:     General: No focal deficit present.     Mental Status: She is alert.  Psychiatric:        Mood and Affect: Mood normal.      UC Treatments / Results  Labs (all labs ordered are listed, but only abnormal results are displayed) Labs Reviewed - No data to display  EKG   Radiology DG Ribs Unilateral W/Chest Right  Result Date: 03/04/2022 CLINICAL DATA:  Rib pain.  Right-sided chest pain for 1 day. EXAM: RIGHT RIBS AND CHEST - 3+ VIEW COMPARISON:  None Available. FINDINGS: Heart size appears normal. No pleural effusion or edema. No airspace opacities identified. No displaced rib fractures identified. Visualized osseous structures appear intact. IMPRESSION: 1. No displaced rib fractures identified. 2. No active cardiopulmonary disease. Electronically Signed   By: Signa Kell M.D.   On: 03/04/2022 16:43    Procedures Procedures (including critical care time)  Medications Ordered in UC Medications - No data to display  Initial Impression / Assessment and Plan / UC Course  I have reviewed the triage vital signs and the nursing notes.  Pertinent labs & imaging results that were available during my care of the patient were reviewed by me and considered in my medical decision making (see chart for details).  Clinical Course as of 03/04/22 1649  Sun Mar 04, 2022  1649 DG Ribs Unilateral W/Chest Right [EP]    Clinical Course User Index [EP] Jannifer Franklin, MD   Final Clinical Impressions(s) / UC Diagnoses   Final diagnoses:  Upper respiratory tract infection, unspecified type  Rib pain     Discharge Instructions      You were seen today for rib pain.  Your xray was negative for fracture.  Your lungs appear normal.  This is likely muscular in nature.  I have sent out a muscle  relaxer.  This may make you tired/sleepy so take when home and not driving.  I recommend motrin for pain. You may also use heat/ice.     ED Prescriptions     Medication Sig Dispense Auth. Provider   cyclobenzaprine (FLEXERIL) 5 MG tablet Take 1 tablet (5 mg total) by mouth 2 (two) times daily as needed for muscle spasms. 14 tablet Jannifer Franklin, MD      PDMP not reviewed this encounter.   Jannifer Franklin, MD 03/04/22 1651    Jannifer Franklin, MD 03/04/22 1719

## 2023-12-16 ENCOUNTER — Encounter (HOSPITAL_COMMUNITY): Payer: Self-pay | Admitting: Emergency Medicine

## 2023-12-16 ENCOUNTER — Ambulatory Visit (HOSPITAL_COMMUNITY): Admission: EM | Admit: 2023-12-16 | Discharge: 2023-12-16 | Disposition: A | Discharge status: Home / Self Care

## 2023-12-16 DIAGNOSIS — R634 Abnormal weight loss: Secondary | ICD-10-CM

## 2023-12-16 DIAGNOSIS — R4589 Other symptoms and signs involving emotional state: Secondary | ICD-10-CM

## 2023-12-16 DIAGNOSIS — R198 Other specified symptoms and signs involving the digestive system and abdomen: Secondary | ICD-10-CM

## 2023-12-16 DIAGNOSIS — F419 Anxiety disorder, unspecified: Secondary | ICD-10-CM | POA: Diagnosis not present

## 2023-12-16 NOTE — ED Triage Notes (Addendum)
 Pt reports lost approx 30 pounds in 3 months. Reports that cut out gluten and dairy bc makes stomach upset. Reports that constantly feeling like have to poop. Sits on toilet about 6 times a day may actually have BM once, mix formed and loose. Reports some nausea. Reports has to make self eat. Pt reports has abd cramping if eats anything. Reports in past month having indigestion.  When started losing the weight started taking vitamins and tried to eat healthier. Denies taking anything at the time.  Doesn't have PCP   Pt very tearful during triage. Reports that she has anxiety of doctors.   Pt answered that today does have thoughts of harming herself. Denies plan.

## 2023-12-16 NOTE — ED Provider Notes (Signed)
 [facility]    CSN: [id number] Arrival date & time: 12/16/23  1210      History   Chief Complaint Chief Complaint  Patient presents with   Weight Loss   Abdominal Pain    HPI Michelle Turner is a 28 y.o. female.   Patient presents with concerns for significant weight loss over the last 3 months.  Patient states that she has lost approximately 30 pounds over the last 3 months.  Patient states that she has had alternating constipation and diarrhea over the last few months as well.  Patient states every time she eats she will have stomach pain and then feel like she needs to go to the bathroom and she either is able to or has diarrhea.  Patient states that she has also had some intermittent nausea.  Patient states that she has also become progressively fatigued over the last few months.  Denies blood in stool, vomiting, fever, and trouble breathing.  Patient states that during this time she did cut out gluten and dairy to see if this would help with her symptoms, but patient states that she continued to have ongoing symptoms and persistent weight loss with this.  Patient states that she did also start trying to take vitamins and eating healthier and this did not help either.  Patient reports that she does not have a primary care provider at this time.  Patient is tearful during triage and does report a history of anxiety around seeing doctors.  Patient states that she has had some thoughts of harming herself but denies any active plan at this time of how to harm herself.  Patient denies any homicidal thoughts, auditory hallucinations, or visual hallucinations.  The history is provided by the patient and medical records.  Abdominal Pain   Past Medical History:  Diagnosis Date   Anorexia    Anxiety    Asthma    Migraines     Patient Active Problem List   Diagnosis Date Noted   SVD (spontaneous vaginal delivery) 07/13/2021   Indication for care in labor or delivery  07/12/2021   Supervision of normal first pregnancy, antepartum 02/23/2021   Rh negative state in antepartum period 02/23/2021    Past Surgical History:  Procedure Laterality Date   LUMBAR PUNCTURE      OB History     Gravida  1   Para  1   Term  1   Preterm      AB      Living  1      SAB      IAB      Ectopic      Multiple  0   Live Births  1            Home Medications    Prior to Admission medications   Medication Sig Start Date End Date Taking? Authorizing Provider  albuterol  (VENTOLIN  HFA) 108 (90 Base) MCG/ACT inhaler Inhale 2 puffs into the lungs every 6 (six) hours as needed for wheezing or shortness of breath. Patient not taking: Reported on 08/30/2021 05/04/21   Synthia Raisin, CNM  cyclobenzaprine  (FLEXERIL ) 5 MG tablet Take 1 tablet (5 mg total) by mouth 2 (two) times daily as needed for muscle spasms. 03/04/22   Piontek, Rocky, MD  ibuprofen  (ADVIL ) 600 MG tablet Take 1 tablet (600 mg total) by mouth every 6 (six) hours. Patient not taking: Reported on 08/30/2021 07/14/21   Trudy Earnie CROME, CNM  lidocaine  (LIDODERM ) 5 %  Place 1 patch onto the skin daily. Remove & Discard patch within 12 hours or as directed by MD.  Use only 1 patch per 24 hours. 12/15/21   Raspet, Rocky POUR, PA-C    Family History Family History  Problem Relation Age of Onset   Hypertension Mother     Social History Social History   Tobacco Use   Smoking status: Never   Smokeless tobacco: Never  Vaping Use   Vaping status: Former  Substance Use Topics   Alcohol use: Not Currently   Drug use: Not Currently    Types: Marijuana     Allergies   Patient has no known allergies.   Review of Systems Review of Systems  Gastrointestinal:  Positive for abdominal pain.   Per HPI  Physical Exam Triage Vital Signs ED Triage Vitals  Encounter Vitals Group     BP 12/16/23 1440 106/72     Girls Systolic BP Percentile --      Girls Diastolic BP Percentile --      Boys  Systolic BP Percentile --      Boys Diastolic BP Percentile --      Pulse Rate 12/16/23 1440 72     Resp 12/16/23 1440 17     Temp 12/16/23 1440 98.2 F (36.8 C)     Temp Source 12/16/23 1440 Oral     SpO2 12/16/23 1440 98 %     Weight --      Height --      Head Circumference --      Peak Flow --      Pain Score 12/16/23 1438 4     Pain Loc --      Pain Education --      Exclude from Growth Chart --    No data found.  Updated Vital Signs BP 106/72 (BP Location: Right Arm)   Pulse 72   Temp 98.2 F (36.8 C) (Oral)   Resp 17   LMP 12/02/2023 (Exact Date)   SpO2 98%   Visual Acuity Right Eye Distance:   Left Eye Distance:   Bilateral Distance:    Right Eye Near:   Left Eye Near:    Bilateral Near:     Physical Exam Vitals and nursing note reviewed.  Constitutional:      General: She is awake. She is not in acute distress.    Appearance: Normal appearance. She is well-developed, well-groomed and underweight. She is ill-appearing. She is not diaphoretic.  Cardiovascular:     Rate and Rhythm: Normal rate and regular rhythm.  Pulmonary:     Effort: Pulmonary effort is normal.     Breath sounds: Normal breath sounds.  Abdominal:     General: Abdomen is flat. Bowel sounds are normal. There is no distension.     Palpations: Abdomen is soft. There is no mass.     Tenderness: There is abdominal tenderness in the periumbilical area. There is no guarding or rebound.     Hernia: No hernia is present.  Skin:    General: Skin is warm and dry.  Neurological:     General: No focal deficit present.     Mental Status: She is alert and oriented to person, place, and time. Mental status is at baseline.  Psychiatric:        Attention and Perception: Attention and perception normal.        Mood and Affect: Mood is anxious. Affect is tearful.  Speech: Speech normal.        Behavior: Behavior normal. Behavior is cooperative.        Thought Content: Thought content normal.         Cognition and Memory: Cognition and memory normal.        Judgment: Judgment normal.      UC Treatments / Results  Labs (all labs ordered are listed, but only abnormal results are displayed) Labs Reviewed - No data to display  EKG   Radiology No results found.  Procedures Procedures (including critical care time)  Medications Ordered in UC Medications - No data to display  Initial Impression / Assessment and Plan / UC Course  I have reviewed the triage vital signs and the nursing notes.  Pertinent labs & imaging results that were available during my care of the patient were reviewed by me and considered in my medical decision making (see chart for details).     Patient is mildly ill-appearing and appears to be underweight.  Mild tenderness noted periumbilical region without palpable mass or hernia present.  Anxious and tearful on exam as well.  Recommended patient be seen in the emergency department for further evaluation of unexplained weight loss with abdominal concerns as well as thoughts of self-harm.  Patient is understanding and agreeable to plan at this time.  Patient is stable to arrived to the ER via POV. Final Clinical Impressions(s) / UC Diagnoses   Final diagnoses:  Unexplained weight loss  Alternating constipation and diarrhea  Anxiety  Thoughts of self-harm     Discharge Instructions      Please go to the department for further evaluation of your unexplained weight loss, abdominal concerns, and thoughts of self-harm.   ED Prescriptions   None    PDMP not reviewed this encounter.   Johnie Flaming A, NP 12/16/23 1530

## 2023-12-16 NOTE — Discharge Instructions (Signed)
 Please go to the department for further evaluation of your unexplained weight loss, abdominal concerns, and thoughts of self-harm.

## 2023-12-16 NOTE — ED Notes (Signed)
 Patient is being discharged from the Urgent Care and sent to the Emergency Department via POV . Per Rumaldo Ryder, NP, patient is in need of higher level of care due to unexplained weight loss, abdominal concerns, and thoughts of self-harm. . Patient is aware and verbalizes understanding of plan of care.  Vitals:   12/16/23 1440  BP: 106/72  Pulse: 72  Resp: 17  Temp: 98.2 F (36.8 C)  SpO2: 98%

## 2023-12-17 ENCOUNTER — Other Ambulatory Visit: Payer: Self-pay

## 2023-12-17 ENCOUNTER — Emergency Department (HOSPITAL_COMMUNITY)
Admission: EM | Admit: 2023-12-17 | Discharge: 2023-12-17 | Disposition: A | Discharge status: Home / Self Care | Attending: Emergency Medicine | Admitting: Emergency Medicine

## 2023-12-17 ENCOUNTER — Emergency Department (HOSPITAL_COMMUNITY)

## 2023-12-17 ENCOUNTER — Encounter (HOSPITAL_COMMUNITY): Payer: Self-pay

## 2023-12-17 DIAGNOSIS — R197 Diarrhea, unspecified: Secondary | ICD-10-CM | POA: Diagnosis not present

## 2023-12-17 DIAGNOSIS — K5900 Constipation, unspecified: Secondary | ICD-10-CM | POA: Diagnosis not present

## 2023-12-17 DIAGNOSIS — R1084 Generalized abdominal pain: Secondary | ICD-10-CM | POA: Insufficient documentation

## 2023-12-17 DIAGNOSIS — R9389 Abnormal findings on diagnostic imaging of other specified body structures: Secondary | ICD-10-CM | POA: Diagnosis not present

## 2023-12-17 DIAGNOSIS — J45909 Unspecified asthma, uncomplicated: Secondary | ICD-10-CM | POA: Diagnosis not present

## 2023-12-17 DIAGNOSIS — R109 Unspecified abdominal pain: Secondary | ICD-10-CM | POA: Diagnosis present

## 2023-12-17 LAB — COMPREHENSIVE METABOLIC PANEL WITH GFR
ALT: 16 U/L (ref 0–44)
AST: 17 U/L (ref 15–41)
Albumin: 4.5 g/dL (ref 3.5–5.0)
Alkaline Phosphatase: 50 U/L (ref 38–126)
Anion gap: 12 (ref 5–15)
BUN: 10 mg/dL (ref 6–20)
CO2: 24 mmol/L (ref 22–32)
Calcium: 9.4 mg/dL (ref 8.9–10.3)
Chloride: 105 mmol/L (ref 98–111)
Creatinine, Ser: 0.69 mg/dL (ref 0.44–1.00)
GFR, Estimated: >60 mL/min (ref >60)
Glucose, Bld: 98 mg/dL (ref 70–99)
Potassium: 3.9 mmol/L (ref 3.5–5.1)
Sodium: 141 mmol/L (ref 135–145)
Total Bilirubin: 0.9 mg/dL (ref 0.0–1.2)
Total Protein: 6.8 g/dL (ref 6.5–8.1)

## 2023-12-17 LAB — CBC WITH DIFFERENTIAL/PLATELET
Abs Immature Granulocytes: 0.01 K/uL (ref 0.00–0.07)
Basophils Absolute: 0 K/uL (ref 0.0–0.1)
Basophils Relative: 0 %
Eosinophils Absolute: 0.1 K/uL (ref 0.0–0.5)
Eosinophils Relative: 3 %
HCT: 43.9 % (ref 36.0–46.0)
Hemoglobin: 14.5 g/dL (ref 12.0–15.0)
Immature Granulocytes: 0 %
Lymphocytes Relative: 27 %
Lymphs Abs: 1.4 K/uL (ref 0.7–4.0)
MCH: 29.2 pg (ref 26.0–34.0)
MCHC: 33 g/dL (ref 30.0–36.0)
MCV: 88.3 fL (ref 80.0–100.0)
Monocytes Absolute: 0.3 K/uL (ref 0.1–1.0)
Monocytes Relative: 6 %
Neutro Abs: 3.3 K/uL (ref 1.7–7.7)
Neutrophils Relative %: 64 %
Platelets: 319 K/uL (ref 150–400)
RBC: 4.97 MIL/uL (ref 3.87–5.11)
RDW: 12.9 % (ref 11.5–15.5)
WBC: 5.1 K/uL (ref 4.0–10.5)
nRBC: 0 % (ref 0.0–0.2)

## 2023-12-17 LAB — URINALYSIS, ROUTINE W REFLEX MICROSCOPIC
Bilirubin Urine: NEGATIVE
Glucose, UA: NEGATIVE mg/dL
Hgb urine dipstick: NEGATIVE
Ketones, ur: NEGATIVE mg/dL
Leukocytes,Ua: NEGATIVE
Nitrite: NEGATIVE
Protein, ur: NEGATIVE mg/dL
Specific Gravity, Urine: 1.008 (ref 1.005–1.030)
pH: 7 (ref 5.0–8.0)

## 2023-12-17 LAB — HCG, SERUM, QUALITATIVE: Preg, Serum: NEGATIVE

## 2023-12-17 LAB — LIPASE, BLOOD: Lipase: 31 U/L (ref 11–51)

## 2023-12-17 MED ORDER — IOHEXOL 300 MG/ML  SOLN
100.0000 mL | Freq: Once | INTRAMUSCULAR | Status: AC | PRN
  Administered 2023-12-17: 100 mL via INTRAVENOUS

## 2023-12-17 MED ORDER — ALBUTEROL SULFATE HFA 108 (90 BASE) MCG/ACT IN AERS: 2.0000 | INHALATION_SPRAY | Freq: Four times a day (QID) | RESPIRATORY_TRACT | 2 refills | Status: AC | PRN

## 2023-12-17 NOTE — ED Triage Notes (Signed)
 Pt reports with lower abdominal pain and weight loss over the past 5 months. Pt states that she has been having digestive issues.

## 2023-12-17 NOTE — Discharge Instructions (Addendum)
 As we discussed, your workup in the ER today was reassuring for acute findings.  Laboratory evaluation and CT imaging did not reveal any emergent cause of your symptoms.  Ultimately, given the chronic nature of the symptoms, I feel that you would greatly benefit from seeing a gastroenterologist.  I have given you a referral with a number to call to schedule an appointment.  Please do so at your earliest convenience.  In the interim, I have attached some additional information that you may find beneficial for management of your symptoms.  Return if development of any new or worsening symptoms.

## 2023-12-17 NOTE — ED Provider Notes (Signed)
 [place] EMERGENCY DEPARTMENT AT [facility] Provider Note   CSN: [id number] Arrival date & time: 12/17/23  1037     Patient presents with: Abdominal Pain   Michelle Turner is a 28 y.o. female.   Patient presents today with complaints of abdominal pain. Reports pain is generalized throughout her abdomen and feels like cramping. She states that eating makes her symptoms worse. Reports symptoms have been persistent and unchanged for the past 3 months. Reports she has had significant changes in her bowel habits during this time. Denies any specific precipitating event. States that sometimes she is constipated and sometimes she has diarrhea. Denies nausea or vomiting. No hematochezia or melena. States she has also unintentionally lost about 30 lbs in the past 3 months. She states that she does feel like eliminating gluten and lactose has helped some, however reports that the only real safe foods she has found are chicken, rice, and mixed vegetables. Denies any changes or worsening in her symptoms today. Went to urgent care yesterday and was directed here for evaluation. She has not seen a pcp or GI for these symptoms.   The history is provided by the patient. No language interpreter was used.  Abdominal Pain      Prior to Admission medications   Medication Sig Start Date End Date Taking? Authorizing Provider  albuterol  (VENTOLIN  HFA) 108 (90 Base) MCG/ACT inhaler Inhale 2 puffs into the lungs every 6 (six) hours as needed for wheezing or shortness of breath. Patient not taking: Reported on 08/30/2021 05/04/21   Synthia Raisin, CNM  cyclobenzaprine  (FLEXERIL ) 5 MG tablet Take 1 tablet (5 mg total) by mouth 2 (two) times daily as needed for muscle spasms. 03/04/22   Piontek, Rocky, MD  ibuprofen  (ADVIL ) 600 MG tablet Take 1 tablet (600 mg total) by mouth every 6 (six) hours. Patient not taking: Reported on 08/30/2021 07/14/21   Trudy Earnie CROME, CNM  lidocaine  (LIDODERM ) 5 % Place 1 patch  onto the skin daily. Remove & Discard patch within 12 hours or as directed by MD.  Use only 1 patch per 24 hours. 12/15/21   Raspet, Erin K, PA-C    Allergies: Patient has no known allergies.    Review of Systems  Gastrointestinal:  Positive for abdominal pain.  All other systems reviewed and are negative.   Updated Vital Signs BP 100/76 (BP Location: Left Arm)   Pulse 84   Temp 98.6 F (37 C) (Oral)   Resp 16   LMP 12/02/2023 (Exact Date)   SpO2 99%   Physical Exam Vitals and nursing note reviewed.  Constitutional:      General: She is not in acute distress.    Appearance: Normal appearance. She is not ill-appearing, toxic-appearing or diaphoretic.     Comments: Mildly underweight  HENT:     Head: Normocephalic and atraumatic.  Cardiovascular:     Rate and Rhythm: Normal rate.  Pulmonary:     Effort: Pulmonary effort is normal. No respiratory distress.  Abdominal:     General: Abdomen is flat.     Palpations: Abdomen is soft.     Tenderness: There is generalized abdominal tenderness. There is no guarding or rebound.  Musculoskeletal:        General: Normal range of motion.     Cervical back: Normal range of motion.  Skin:    General: Skin is warm and dry.  Neurological:     General: No focal deficit present.     Mental Status:  She is alert.  Psychiatric:        Mood and Affect: Mood normal.        Behavior: Behavior normal.     (all labs ordered are listed, but only abnormal results are displayed) Labs Reviewed  URINALYSIS, ROUTINE W REFLEX MICROSCOPIC - Abnormal; Notable for the following components:      Result Value   Color, Urine STRAW (*)    All other components within normal limits  COMPREHENSIVE METABOLIC PANEL WITH GFR  LIPASE, BLOOD  CBC WITH DIFFERENTIAL/PLATELET  HCG, SERUM, QUALITATIVE    EKG: None  Radiology: CT ABDOMEN PELVIS W CONTRAST Result Date: 12/17/2023 CLINICAL DATA:  Acute nonlocalized abdominal pain. Lower abdominal pain and  weight loss for 5 months. Digestive issues. EXAM: CT ABDOMEN AND PELVIS WITH CONTRAST TECHNIQUE: Multidetector CT imaging of the abdomen and pelvis was performed using the standard protocol following bolus administration of intravenous contrast. RADIATION DOSE REDUCTION: This exam was performed according to the departmental dose-optimization program which includes automated exposure control, adjustment of the mA and/or kV according to patient size and/or use of iterative reconstruction technique. CONTRAST:  OMNIPAQUE  IOHEXOL  300 MG/ML  SOLN COMPARISON:  None Available. FINDINGS: Lower chest: Lung bases are clear. Hepatobiliary: Diffusely heterogeneous parenchymal pattern to the liver without specific focal lesion. This may be transient and associated with the phase of contrast bolus but could indicate diffuse process such as early cirrhosis or hepatitis. Hepatic veins are not opacified, most likely due to phase of contrast bolus. No abscess identified. Portal veins are patent. No bile duct dilatation. Gallbladder is normal. Pancreas: Unremarkable. No pancreatic ductal dilatation or surrounding inflammatory changes. Spleen: Normal in size without focal abnormality. Adrenals/Urinary Tract: Adrenal glands are unremarkable. Kidneys are normal, without renal calculi, focal lesion, or hydronephrosis. Bladder is unremarkable. Stomach/Bowel: Stomach, small bowel, and colon are not abnormally distended. Scattered stool throughout the colon. No wall thickening or inflammatory stranding is identified. Appendix is normal. Vascular/Lymphatic: No significant vascular findings are present. No enlarged abdominal or pelvic lymph nodes. Reproductive: Uterus and bilateral adnexa are unremarkable. Other: No abdominal wall hernia or abnormality. No abdominopelvic ascites. Musculoskeletal: No acute or significant osseous findings. IMPRESSION: 1. Heterogeneous parenchymal appearance to the liver possibly artifact related to timing  of contrast bolus but possibly indicating diffuse liver disease such as early cirrhosis or hepatitis. No biliary obstruction. 2. No evidence of bowel obstruction or inflammation. Electronically Signed   By: Elsie Gravely M.D.   On: 12/17/2023 16:25     Procedures   Medications Ordered in the ED  iohexol  (OMNIPAQUE ) 300 MG/ML solution 100 mL (has no administration in time range)                                    Medical Decision Making Amount and/or Complexity of Data Reviewed Labs: ordered. Radiology: ordered.  Risk Prescription drug management.   This patient is a 28 y.o. female who presents to the ED for concern of abdominal pain, fatigue, weight loss, this involves an extensive number of treatment options, and is a complaint that carries with it a high risk of complications and morbidity. The emergent differential diagnosis prior to evaluation includes, but is not limited to,  AAA, gastroenteritis, appendicitis, Bowel obstruction, Bowel perforation. Gastroparesis, DKA, Hernia, Inflammatory bowel disease, mesenteric ischemia, pancreatitis, peritonitis SBP, volvulus.  This is not an exhaustive differential.   Past Medical History / Co-morbidities /  Social History:  has a past medical history of Anorexia, Anxiety, Asthma, and Migraines.  Additional history: Chart reviewed. Pertinent results include: seen at urgent care yesterday, told to come here for evaluation  Physical Exam: Physical exam performed. The pertinent findings include: underweight but overall well appearing, mild generalized abdominal TTP without rebound or guarding  Lab Tests: I ordered, and personally interpreted labs.  The pertinent results include: No acute laboratory abnormalities   Imaging Studies: I ordered imaging studies including CT abdomen pelvis. I independently visualized and interpreted imaging which showed   1. Heterogeneous parenchymal appearance to the liver possibly artifact related to  timing of contrast bolus but possibly indicating diffuse liver disease such as early cirrhosis or hepatitis. No biliary obstruction. 2. No evidence of bowel obstruction or inflammation.  I agree with the radiologist interpretation.   Medications: Offered pain and nausea meds which patient declined  Disposition: After consideration of the diagnostic results and the patients response to treatment, I feel that emergency department workup does not suggest an emergent condition requiring admission or immediate intervention beyond what has been performed at this time. The plan is: discharge with GI follow-up and return precautions. Patients work-up is benign, she feels overall well and ready to go home. Given the chronic nature of her symptoms, feel that she would certainly benefit from GI follow-up for management of her symptoms. Did discuss with her findings on her CT, however I suspect the liver findings on her imaging is artifact given that her LFTs are normal. Evaluation and diagnostic testing in the emergency department does not suggest an emergent condition requiring admission or immediate intervention beyond what has been performed at this time.  Plan for discharge with close PCP follow-up.  Patient is understanding and amenable with plan, educated on red flag symptoms that would prompt immediate return.  Patient discharged in stable condition.   This is a shared visit with supervising physician Dr. Rogelia who has independently evaluated patient & provided guidance in evaluation/management/disposition, in agreement with care   Final diagnoses:  Generalized abdominal pain  Abnormal CT scan    ED Discharge Orders     None     An After Visit Summary was printed and given to the patient.      Shandrea Lusk A, PA-C 12/17/23 1721    Stanek, Lawrence S, MD 12/20/23 [phone]

## 2023-12-17 NOTE — ED Notes (Signed)
 Pt ambulatory to waiting room. Pt verbalized understanding of discharge instructions.
